# Patient Record
Sex: Male | Born: 1945 | Race: White | Hispanic: No | Marital: Married | Smoking: Former smoker
Health system: Southern US, Community
[De-identification: ages and names within clinical notes are randomized; demographics above are authoritative.]

## PROBLEM LIST (undated history)

## (undated) DIAGNOSIS — K219 Gastro-esophageal reflux disease without esophagitis: Secondary | ICD-10-CM

## (undated) DIAGNOSIS — R0600 Dyspnea, unspecified: Secondary | ICD-10-CM

## (undated) DIAGNOSIS — F4310 Post-traumatic stress disorder, unspecified: Secondary | ICD-10-CM

## (undated) DIAGNOSIS — J45909 Unspecified asthma, uncomplicated: Secondary | ICD-10-CM

## (undated) DIAGNOSIS — I10 Essential (primary) hypertension: Secondary | ICD-10-CM

## (undated) DIAGNOSIS — E119 Type 2 diabetes mellitus without complications: Secondary | ICD-10-CM

## (undated) HISTORY — PX: FINGER SURGERY: SHX640

## (undated) HISTORY — PX: HERNIA REPAIR: SHX51

## (undated) HISTORY — PX: OTHER SURGICAL HISTORY: SHX169

## (undated) HISTORY — PX: MANDIBLE SURGERY: SHX707

## (undated) HISTORY — PX: COLONOSCOPY: SHX174

---

## 2019-08-28 ENCOUNTER — Encounter (HOSPITAL_COMMUNITY)
Admission: EM | Disposition: A | Discharge status: Home / Self Care | Payer: Self-pay | Source: Home / Self Care | Attending: Emergency Medicine

## 2019-08-28 ENCOUNTER — Emergency Department (HOSPITAL_COMMUNITY): Payer: Medicare Other

## 2019-08-28 ENCOUNTER — Other Ambulatory Visit: Payer: Self-pay

## 2019-08-28 ENCOUNTER — Emergency Department (HOSPITAL_COMMUNITY): Payer: Medicare Other | Admitting: Anesthesiology

## 2019-08-28 ENCOUNTER — Encounter (HOSPITAL_COMMUNITY): Payer: Self-pay | Admitting: Emergency Medicine

## 2019-08-28 ENCOUNTER — Ambulatory Visit (HOSPITAL_COMMUNITY)
Admission: EM | Admit: 2019-08-28 | Discharge: 2019-08-28 | Disposition: A | Discharge status: Home / Self Care | Payer: Medicare Other | Attending: Emergency Medicine | Admitting: Emergency Medicine

## 2019-08-28 DIAGNOSIS — S91012A Laceration without foreign body, left ankle, initial encounter: Secondary | ICD-10-CM | POA: Diagnosis not present

## 2019-08-28 DIAGNOSIS — E119 Type 2 diabetes mellitus without complications: Secondary | ICD-10-CM | POA: Diagnosis not present

## 2019-08-28 DIAGNOSIS — W208XXA Other cause of strike by thrown, projected or falling object, initial encounter: Secondary | ICD-10-CM | POA: Insufficient documentation

## 2019-08-28 DIAGNOSIS — Z7984 Long term (current) use of oral hypoglycemic drugs: Secondary | ICD-10-CM | POA: Insufficient documentation

## 2019-08-28 DIAGNOSIS — Z7982 Long term (current) use of aspirin: Secondary | ICD-10-CM | POA: Insufficient documentation

## 2019-08-28 DIAGNOSIS — Z20828 Contact with and (suspected) exposure to other viral communicable diseases: Secondary | ICD-10-CM | POA: Diagnosis not present

## 2019-08-28 DIAGNOSIS — S8252XA Displaced fracture of medial malleolus of left tibia, initial encounter for closed fracture: Secondary | ICD-10-CM | POA: Insufficient documentation

## 2019-08-28 DIAGNOSIS — Z79899 Other long term (current) drug therapy: Secondary | ICD-10-CM | POA: Insufficient documentation

## 2019-08-28 DIAGNOSIS — R0602 Shortness of breath: Secondary | ICD-10-CM | POA: Insufficient documentation

## 2019-08-28 DIAGNOSIS — I10 Essential (primary) hypertension: Secondary | ICD-10-CM | POA: Diagnosis not present

## 2019-08-28 DIAGNOSIS — S90511A Abrasion, right ankle, initial encounter: Secondary | ICD-10-CM | POA: Insufficient documentation

## 2019-08-28 HISTORY — PX: I & D EXTREMITY: SHX5045

## 2019-08-28 HISTORY — DX: Essential (primary) hypertension: I10

## 2019-08-28 HISTORY — DX: Dyspnea, unspecified: R06.00

## 2019-08-28 HISTORY — DX: Type 2 diabetes mellitus without complications: E11.9

## 2019-08-28 LAB — CBC WITH DIFFERENTIAL/PLATELET
Abs Immature Granulocytes: 0.07 10*3/uL (ref 0.00–0.07)
Basophils Absolute: 0.1 10*3/uL (ref 0.0–0.1)
Basophils Relative: 1 %
Eosinophils Absolute: 0.2 10*3/uL (ref 0.0–0.5)
Eosinophils Relative: 3 %
HCT: 42.1 % (ref 39.0–52.0)
Hemoglobin: 13.9 g/dL (ref 13.0–17.0)
Immature Granulocytes: 1 %
Lymphocytes Relative: 19 %
Lymphs Abs: 1.4 10*3/uL (ref 0.7–4.0)
MCH: 32.1 pg (ref 26.0–34.0)
MCHC: 33 g/dL (ref 30.0–36.0)
MCV: 97.2 fL (ref 80.0–100.0)
Monocytes Absolute: 0.7 10*3/uL (ref 0.1–1.0)
Monocytes Relative: 9 %
Neutro Abs: 4.9 10*3/uL (ref 1.7–7.7)
Neutrophils Relative %: 67 %
Platelets: 239 10*3/uL (ref 150–400)
RBC: 4.33 MIL/uL (ref 4.22–5.81)
RDW: 13.1 % (ref 11.5–15.5)
WBC: 7.3 10*3/uL (ref 4.0–10.5)
nRBC: 0 % (ref 0.0–0.2)

## 2019-08-28 LAB — COMPREHENSIVE METABOLIC PANEL
ALT: 24 U/L (ref 0–44)
AST: 23 U/L (ref 15–41)
Albumin: 4.1 g/dL (ref 3.5–5.0)
Alkaline Phosphatase: 51 U/L (ref 38–126)
Anion gap: 10 (ref 5–15)
BUN: 16 mg/dL (ref 8–23)
CO2: 23 mmol/L (ref 22–32)
Calcium: 9.1 mg/dL (ref 8.9–10.3)
Chloride: 104 mmol/L (ref 98–111)
Creatinine, Ser: 0.95 mg/dL (ref 0.61–1.24)
GFR calc Af Amer: >60 mL/min (ref >60)
GFR calc non Af Amer: >60 mL/min (ref >60)
Glucose, Bld: 206 mg/dL — ABNORMAL HIGH (ref 70–99)
Potassium: 3.5 mmol/L (ref 3.5–5.1)
Sodium: 137 mmol/L (ref 135–145)
Total Bilirubin: 0.7 mg/dL (ref 0.3–1.2)
Total Protein: 6.4 g/dL — ABNORMAL LOW (ref 6.5–8.1)

## 2019-08-28 LAB — TROPONIN I (HIGH SENSITIVITY)
Troponin I (High Sensitivity): 39 ng/L — ABNORMAL HIGH (ref <18)
Troponin I (High Sensitivity): 38 ng/L — ABNORMAL HIGH (ref <18)

## 2019-08-28 LAB — GLUCOSE, CAPILLARY
Glucose-Capillary: 123 mg/dL — ABNORMAL HIGH (ref 70–99)
Glucose-Capillary: 103 mg/dL — ABNORMAL HIGH (ref 70–99)

## 2019-08-28 LAB — SARS CORONAVIRUS 2 BY RT PCR (HOSPITAL ORDER, PERFORMED IN ~~LOC~~ HOSPITAL LAB): SARS Coronavirus 2: NEGATIVE

## 2019-08-28 LAB — BRAIN NATRIURETIC PEPTIDE: B Natriuretic Peptide: 50.7 pg/mL (ref 0.0–100.0)

## 2019-08-28 LAB — D-DIMER, QUANTITATIVE: D-Dimer, Quant: 0.66 ug/mL-FEU — ABNORMAL HIGH (ref 0.00–0.50)

## 2019-08-28 SURGERY — IRRIGATION AND DEBRIDEMENT EXTREMITY
Anesthesia: General | Site: Ankle | Laterality: Left

## 2019-08-28 MED ORDER — BUPIVACAINE HCL (PF) 0.25 % IJ SOLN
INTRAMUSCULAR | Status: AC
  Filled 2019-08-28: qty 30

## 2019-08-28 MED ORDER — TETANUS-DIPHTH-ACELL PERTUSSIS 5-2.5-18.5 LF-MCG/0.5 IM SUSP
0.5000 mL | Freq: Once | INTRAMUSCULAR | Status: AC
  Administered 2019-08-28: 16:00:00 0.5 mL via INTRAMUSCULAR
  Filled 2019-08-28: qty 0.5

## 2019-08-28 MED ORDER — ASPIRIN EC 81 MG PO TBEC: 81.0000 mg | DELAYED_RELEASE_TABLET | Freq: Two times a day (BID) | ORAL | 0 refills | Status: AC

## 2019-08-28 MED ORDER — ONDANSETRON HCL 4 MG/2ML IJ SOLN
INTRAMUSCULAR | Status: AC
  Filled 2019-08-28: qty 2

## 2019-08-28 MED ORDER — FENTANYL CITRATE (PF) 100 MCG/2ML IJ SOLN
INTRAMUSCULAR | Status: AC
  Filled 2019-08-28: qty 2

## 2019-08-28 MED ORDER — CEFAZOLIN SODIUM-DEXTROSE 2-3 GM-%(50ML) IV SOLR
INTRAVENOUS | Status: DC | PRN
  Administered 2019-08-28: 2 g via INTRAVENOUS

## 2019-08-28 MED ORDER — SODIUM CHLORIDE 0.9 % IV SOLN
3.0000 g | Freq: Once | INTRAVENOUS | Status: AC
  Administered 2019-08-28: 16:00:00 3 g via INTRAVENOUS
  Filled 2019-08-28: qty 8

## 2019-08-28 MED ORDER — OXYCODONE HCL 5 MG PO TABS: 5.0000 mg | ORAL_TABLET | Freq: Four times a day (QID) | ORAL | 0 refills | Status: DC | PRN

## 2019-08-28 MED ORDER — EPHEDRINE 5 MG/ML INJ
INTRAVENOUS | Status: AC
  Filled 2019-08-28: qty 10

## 2019-08-28 MED ORDER — FENTANYL CITRATE (PF) 100 MCG/2ML IJ SOLN
25.0000 ug | INTRAMUSCULAR | Status: DC | PRN
  Administered 2019-08-28 (×2): 25 ug via INTRAVENOUS

## 2019-08-28 MED ORDER — ONDANSETRON HCL 4 MG/2ML IJ SOLN: 4.0000 mg | Freq: Once | INTRAMUSCULAR | Status: DC | PRN

## 2019-08-28 MED ORDER — CEPHALEXIN 500 MG PO CAPS: 500.0000 mg | ORAL_CAPSULE | Freq: Three times a day (TID) | ORAL | 0 refills | Status: AC

## 2019-08-28 MED ORDER — CEFAZOLIN SODIUM 1 G IJ SOLR
INTRAMUSCULAR | Status: AC
  Filled 2019-08-28: qty 20

## 2019-08-28 MED ORDER — PROPOFOL 10 MG/ML IV BOLUS
INTRAVENOUS | Status: DC | PRN
  Administered 2019-08-28: 150 mg via INTRAVENOUS

## 2019-08-28 MED ORDER — ONDANSETRON HCL 4 MG PO TABS: 4.0000 mg | ORAL_TABLET | Freq: Three times a day (TID) | ORAL | 0 refills | Status: DC | PRN

## 2019-08-28 MED ORDER — LIDOCAINE 2% (20 MG/ML) 5 ML SYRINGE
INTRAMUSCULAR | Status: AC
  Filled 2019-08-28: qty 5

## 2019-08-28 MED ORDER — SODIUM CHLORIDE 0.9 % IR SOLN
Status: DC | PRN
  Administered 2019-08-28: 1000 mL

## 2019-08-28 MED ORDER — DEXAMETHASONE SODIUM PHOSPHATE 10 MG/ML IJ SOLN
INTRAMUSCULAR | Status: AC
  Filled 2019-08-28: qty 1

## 2019-08-28 MED ORDER — BACLOFEN 10 MG PO TABS: 10.0000 mg | ORAL_TABLET | Freq: Two times a day (BID) | ORAL | 0 refills | Status: DC | PRN

## 2019-08-28 MED ORDER — STERILE WATER FOR IRRIGATION IR SOLN
Status: DC | PRN
  Administered 2019-08-28: 400 mL

## 2019-08-28 MED ORDER — SUCCINYLCHOLINE CHLORIDE 200 MG/10ML IV SOSY
PREFILLED_SYRINGE | INTRAVENOUS | Status: DC | PRN
  Administered 2019-08-28: 100 mg via INTRAVENOUS

## 2019-08-28 MED ORDER — OXYCODONE HCL 5 MG PO TABS
ORAL_TABLET | ORAL | Status: AC
  Filled 2019-08-28: qty 1

## 2019-08-28 MED ORDER — CELECOXIB 100 MG PO CAPS: 100.0000 mg | ORAL_CAPSULE | Freq: Two times a day (BID) | ORAL | 0 refills | Status: AC

## 2019-08-28 MED ORDER — OXYCODONE HCL 5 MG PO TABS
5.0000 mg | ORAL_TABLET | Freq: Once | ORAL | Status: AC
  Administered 2019-08-28: 22:00:00 5 mg via ORAL

## 2019-08-28 MED ORDER — SUFENTANIL CITRATE 50 MCG/ML IV SOLN
INTRAVENOUS | Status: AC
  Filled 2019-08-28: qty 1

## 2019-08-28 MED ORDER — ONDANSETRON HCL 4 MG/2ML IJ SOLN
INTRAMUSCULAR | Status: DC | PRN
  Administered 2019-08-28: 4 mg via INTRAVENOUS

## 2019-08-28 MED ORDER — AEROCHAMBER PLUS FLO-VU SMALL MISC
1.0000 | Freq: Once | Status: DC
  Filled 2019-08-28: qty 1

## 2019-08-28 MED ORDER — LIDOCAINE 2% (20 MG/ML) 5 ML SYRINGE
INTRAMUSCULAR | Status: DC | PRN
  Administered 2019-08-28: 100 mg via INTRAVENOUS

## 2019-08-28 MED ORDER — ALBUTEROL SULFATE HFA 108 (90 BASE) MCG/ACT IN AERS: 2.0000 | INHALATION_SPRAY | Freq: Once | RESPIRATORY_TRACT | Status: DC

## 2019-08-28 MED ORDER — EPHEDRINE SULFATE 50 MG/ML IJ SOLN
INTRAMUSCULAR | Status: DC | PRN
  Administered 2019-08-28: 10 mg via INTRAVENOUS

## 2019-08-28 MED ORDER — LACTATED RINGERS IV SOLN
INTRAVENOUS | Status: DC
  Administered 2019-08-28 (×2): via INTRAVENOUS

## 2019-08-28 MED ORDER — SODIUM CHLORIDE 0.9 % IR SOLN
Status: DC | PRN
  Administered 2019-08-28: 3000 mL

## 2019-08-28 MED ORDER — BUPIVACAINE HCL 0.25 % IJ SOLN
INTRAMUSCULAR | Status: DC | PRN
  Administered 2019-08-28: 30 mL

## 2019-08-28 MED ORDER — SUCCINYLCHOLINE CHLORIDE 200 MG/10ML IV SOSY
PREFILLED_SYRINGE | INTRAVENOUS | Status: AC
  Filled 2019-08-28: qty 10

## 2019-08-28 MED ORDER — PROPOFOL 10 MG/ML IV BOLUS
INTRAVENOUS | Status: AC
  Filled 2019-08-28: qty 20

## 2019-08-28 MED ORDER — SUFENTANIL CITRATE 50 MCG/ML IV SOLN
INTRAVENOUS | Status: DC | PRN
  Administered 2019-08-28 (×2): 10 ug via INTRAVENOUS

## 2019-08-28 MED ORDER — GLYCOPYRROLATE 0.2 MG/ML IJ SOLN
INTRAMUSCULAR | Status: DC | PRN
  Administered 2019-08-28: 0.2 mg via INTRAVENOUS

## 2019-08-28 MED ORDER — ACETAMINOPHEN 500 MG PO TABS: 1000.0000 mg | ORAL_TABLET | Freq: Three times a day (TID) | ORAL | 0 refills | Status: AC

## 2019-08-28 MED ORDER — SODIUM CHLORIDE (PF) 0.9 % IJ SOLN
INTRAMUSCULAR | Status: AC
  Filled 2019-08-28: qty 10

## 2019-08-28 MED ORDER — GLYCOPYRROLATE PF 0.2 MG/ML IJ SOSY
PREFILLED_SYRINGE | INTRAMUSCULAR | Status: AC
  Filled 2019-08-28: qty 1

## 2019-08-28 MED ORDER — DEXAMETHASONE SODIUM PHOSPHATE 10 MG/ML IJ SOLN
INTRAMUSCULAR | Status: DC | PRN
  Administered 2019-08-28: 10 mg via INTRAVENOUS

## 2019-08-28 SURGICAL SUPPLY — 57 items
BANDAGE ESMARK 6X9 LF (GAUZE/BANDAGES/DRESSINGS) IMPLANT
BLADE SURG 10 STRL SS (BLADE) IMPLANT
BNDG COHESIVE 4X5 TAN STRL (GAUZE/BANDAGES/DRESSINGS) IMPLANT
BNDG ELASTIC 4X5.8 VLCR STR LF (GAUZE/BANDAGES/DRESSINGS) ×2 IMPLANT
BNDG ELASTIC 6X5.8 VLCR STR LF (GAUZE/BANDAGES/DRESSINGS) ×2 IMPLANT
BNDG ESMARK 4X9 LF (GAUZE/BANDAGES/DRESSINGS) IMPLANT
BNDG ESMARK 6X9 LF (GAUZE/BANDAGES/DRESSINGS)
BNDG GAUZE ELAST 4 BULKY (GAUZE/BANDAGES/DRESSINGS) ×2 IMPLANT
CONT SPEC 4OZ CLIKSEAL STRL BL (MISCELLANEOUS) IMPLANT
COVER SURGICAL LIGHT HANDLE (MISCELLANEOUS) ×2 IMPLANT
COVER WAND RF STERILE (DRAPES) IMPLANT
CUFF TOURN SGL LL 12 NO SLV (MISCELLANEOUS) ×2 IMPLANT
CUFF TOURN SGL QUICK 34 (TOURNIQUET CUFF)
CUFF TRNQT CYL 34X4.125X (TOURNIQUET CUFF) IMPLANT
DRAPE SURG 17X23 STRL (DRAPES) IMPLANT
DRAPE U-SHAPE 47X51 STRL (DRAPES) ×2 IMPLANT
DRSG ADAPTIC 3X8 NADH LF (GAUZE/BANDAGES/DRESSINGS) ×2 IMPLANT
DRSG PAD ABDOMINAL 8X10 ST (GAUZE/BANDAGES/DRESSINGS) IMPLANT
DURAPREP 26ML APPLICATOR (WOUND CARE) IMPLANT
ELECT REM PT RETURN 9FT ADLT (ELECTROSURGICAL) ×2
ELECTRODE REM PT RTRN 9FT ADLT (ELECTROSURGICAL) ×1 IMPLANT
EVACUATOR 1/8 PVC DRAIN (DRAIN) IMPLANT
FACESHIELD WRAPAROUND (MASK) IMPLANT
GAUZE SPONGE 4X4 12PLY STRL (GAUZE/BANDAGES/DRESSINGS) IMPLANT
GAUZE SPONGE 4X4 12PLY STRL LF (GAUZE/BANDAGES/DRESSINGS) ×2 IMPLANT
GAUZE XEROFORM 1X8 LF (GAUZE/BANDAGES/DRESSINGS) IMPLANT
GLOVE BIO SURGEON STRL SZ7.5 (GLOVE) ×4 IMPLANT
GLOVE BIO SURGEON STRL SZ8 (GLOVE) ×2 IMPLANT
GLOVE BIOGEL PI IND STRL 8 (GLOVE) ×2 IMPLANT
GLOVE BIOGEL PI INDICATOR 8 (GLOVE) ×2
GOWN STRL REUS W/ TWL LRG LVL3 (GOWN DISPOSABLE) ×3 IMPLANT
GOWN STRL REUS W/TWL LRG LVL3 (GOWN DISPOSABLE) ×3
HANDPIECE INTERPULSE COAX TIP (DISPOSABLE)
KIT BASIN OR (CUSTOM PROCEDURE TRAY) ×2 IMPLANT
KIT TURNOVER KIT B (KITS) ×2 IMPLANT
MANIFOLD NEPTUNE II (INSTRUMENTS) ×2 IMPLANT
NEEDLE HYPO 25GX1X1/2 BEV (NEEDLE) ×2 IMPLANT
NS IRRIG 1000ML POUR BTL (IV SOLUTION) ×2 IMPLANT
PACK ORTHO EXTREMITY (CUSTOM PROCEDURE TRAY) ×2 IMPLANT
PAD ABD 8X10 STRL (GAUZE/BANDAGES/DRESSINGS) ×2 IMPLANT
PAD ARMBOARD 7.5X6 YLW CONV (MISCELLANEOUS) ×2 IMPLANT
PAD CAST 4YDX4 CTTN HI CHSV (CAST SUPPLIES) ×1 IMPLANT
PADDING CAST COTTON 4X4 STRL (CAST SUPPLIES) ×1
PADDING CAST COTTON 6X4 STRL (CAST SUPPLIES) ×2 IMPLANT
SET CYSTO W/LG BORE CLAMP LF (SET/KITS/TRAYS/PACK) ×2 IMPLANT
SET HNDPC FAN SPRY TIP SCT (DISPOSABLE) IMPLANT
SPONGE LAP 18X18 RF (DISPOSABLE) IMPLANT
STOCKINETTE IMPERVIOUS 9X36 MD (GAUZE/BANDAGES/DRESSINGS) ×2 IMPLANT
SUT ETHILON 3 0 PS 1 (SUTURE) ×8 IMPLANT
SUT PDS AB 2-0 CT1 27 (SUTURE) IMPLANT
SWAB CULTURE ESWAB REG 1ML (MISCELLANEOUS) IMPLANT
SYR CONTROL 10ML LL (SYRINGE) ×2 IMPLANT
TOWEL GREEN STERILE (TOWEL DISPOSABLE) ×2 IMPLANT
TOWEL GREEN STERILE FF (TOWEL DISPOSABLE) IMPLANT
TUBE CONNECTING 12X1/4 (SUCTIONS) ×2 IMPLANT
UNDERPAD 30X30 (UNDERPADS AND DIAPERS) ×2 IMPLANT
YANKAUER SUCT BULB TIP NO VENT (SUCTIONS) ×2 IMPLANT

## 2019-08-28 NOTE — Anesthesia Preprocedure Evaluation (Addendum)
Anesthesia Evaluation  Patient identified by MRN, date of birth, ID band Patient awake    Reviewed: Allergy & Precautions, NPO status , Patient's Chart, lab work & pertinent test results  Airway Mallampati: II  TM Distance: >3 FB     Dental  (+) Dental Advisory Given   Pulmonary shortness of breath and with exertion,    breath sounds clear to auscultation       Cardiovascular hypertension,  Rhythm:Regular Rate:Normal     Neuro/Psych negative neurological ROS     GI/Hepatic negative GI ROS, Neg liver ROS,   Endo/Other  diabetes  Renal/GU negative Renal ROS     Musculoskeletal   Abdominal   Peds  Hematology negative hematology ROS (+)   Anesthesia Other Findings   Reproductive/Obstetrics                             Lab Results  Component Value Date   WBC 7.3 08/28/2019   HGB 13.9 08/28/2019   HCT 42.1 08/28/2019   MCV 97.2 08/28/2019   PLT 239 08/28/2019   Lab Results  Component Value Date   CREATININE 0.95 08/28/2019   BUN 16 08/28/2019   NA 137 08/28/2019   K 3.5 08/28/2019   CL 104 08/28/2019   CO2 23 08/28/2019    Anesthesia Physical Anesthesia Plan  ASA: III  Anesthesia Plan: General   Post-op Pain Management:    Induction: Intravenous  PONV Risk Score and Plan: 2 and Dexamethasone, Ondansetron and Treatment may vary due to age or medical condition  Airway Management Planned: Oral ETT  Additional Equipment:   Intra-op Plan:   Post-operative Plan: Extubation in OR  Informed Consent: I have reviewed the patients History and Physical, chart, labs and discussed the procedure including the risks, benefits and alternatives for the proposed anesthesia with the patient or authorized representative who has indicated his/her understanding and acceptance.     Dental advisory given  Plan Discussed with: CRNA  Anesthesia Plan Comments:        Anesthesia Quick  Evaluation

## 2019-08-28 NOTE — Transfer of Care (Signed)
Immediate Anesthesia Transfer of Care Note  Patient: Phillip Hamilton  Procedure(s) Performed: IRRIGATION AND DEBRIDEMENT EXTREMITY (Left )  Patient Location: PACU  Anesthesia Type:General  Level of Consciousness: awake, alert , oriented and patient cooperative  Airway & Oxygen Therapy: Patient Spontanous Breathing and Patient connected to nasal cannula oxygen  Post-op Assessment: Report given to RN, Post -op Vital signs reviewed and stable and Patient moving all extremities X 4  Post vital signs: Reviewed and stable  Last Vitals:  Vitals Value Taken Time  BP 165/98 08/28/19 2148  Temp    Pulse 93 08/28/19 2151  Resp 20 08/28/19 2151  SpO2 100 % 08/28/19 2151  Vitals shown include unvalidated device data.  Last Pain:  Vitals:   08/28/19 1517  TempSrc: Oral  PainSc:          Complications: No apparent anesthesia complications

## 2019-08-28 NOTE — ED Notes (Signed)
Pt's wife called and wants to get an update on pt. She is in [place] and she doesn't know if she needs to come home or not.  Lelon Frohlich at [phone]

## 2019-08-28 NOTE — Progress Notes (Signed)
Orthopedic Tech Progress Note Patient Details:  Phillip Hamilton 1946/07/01 [id number]  Ortho Devices Type of Ortho Device: Crutches Ortho Device/Splint Location: delivered to or. Ortho Device/Splint Interventions: Ordered, Application, Adjustment   Post Interventions Patient Tolerated: Well Instructions Provided: Care of device, Adjustment of device   Karolee Stamps 08/28/2019, 11:03 PM

## 2019-08-28 NOTE — Anesthesia Procedure Notes (Signed)
Procedure Name: Intubation Date/Time: 08/28/2019 8:46 PM Performed by: Claris Che, CRNA Pre-anesthesia Checklist: Patient identified, Emergency Drugs available, Suction available, Patient being monitored and Timeout performed Patient Re-evaluated:Patient Re-evaluated prior to induction Oxygen Delivery Method: Circle system utilized Preoxygenation: Pre-oxygenation with 100% oxygen Induction Type: IV induction, Rapid sequence and Cricoid Pressure applied Laryngoscope Size: Mac and 3 Grade View: Grade III Tube type: Oral Tube size: 7.5 mm Number of attempts: 1 Airway Equipment and Method: Stylet Placement Confirmation: ETT inserted through vocal cords under direct vision,  positive ETCO2 and breath sounds checked- equal and bilateral Secured at: 24 cm Tube secured with: Tape Dental Injury: Teeth and Oropharynx as per pre-operative assessment

## 2019-08-28 NOTE — Progress Notes (Signed)
Orthopedic Tech Progress Note Patient Details:  Phillip Hamilton [birth year] [id number]  Ortho Devices Type of Ortho Device: CAM walker Ortho Device/Splint Location: delivered to or.       Karolee Stamps 08/28/2019, 10:38 PM

## 2019-08-28 NOTE — Discharge Instructions (Signed)
It is very important for you to elevate your foot above your heart at all times or as much as reasonably possible to reduce swelling and pain.    Diet: As you were doing prior to hospitalization   Shower:  Keep the wounds dry, use an occlusive plastic wrap, NO SOAKING IN TUB.  If the bandage gets wet, change with a clean dry gauze.  If you have a splint on, leave the splint in place and keep the splint dry with a plastic bag.  Dressing:  Keep compressive dressings on and dry until follow up.  Weight Bearing:   As tolerated in walking boot.  Limit walking to essential movement.    To prevent constipation: you may use a stool softener such as -  Colace (over the counter) 100 mg by mouth twice a day  Drink plenty of fluids (prune juice may be helpful) and high fiber foods Miralax (over the counter) for constipation as needed.    Itching:  If you experience itching with your medications, try taking only a single pain pill, or even half a pain pill at a time.  You can also use benadryl over the counter for itching or also to help with sleep.   Precautions:  If you experience chest pain or shortness of breath - call 911 immediately for transfer to the hospital emergency department!!  If you develop a fever greater that 101 F, purulent drainage from wound, increased redness or drainage from wound, or calf pain -- Call the office at [phone]                                                 Follow- Up Appointment:  Please call for an appointment to be seen on 09/01/19 [facility] - [phone]     Please follow-up with pulmonologist doctor for further evaluation of your shortness of breath.

## 2019-08-28 NOTE — ED Triage Notes (Signed)
[facility]- pt here for a left ankle injury from a tractor accident today. Pt states a piece of tractor equipment fell on him and caught his ankle underneath. Pt also has been having SOB for 4 months.

## 2019-08-28 NOTE — H&P (Signed)
ORTHOPAEDIC CONSULTATION  REQUESTING PHYSICIAN: No att. providers found  Chief Complaint: left ankle laceration.  HPI: Phillip Hamilton is a 73 y.o. male who complains of L ankle pain after a piece of metal equipment fell and scraped down both legs and feet. Xrays have been negative for fracture.   Past Medical History:  Diagnosis Date  . Diabetes mellitus without complication ([place])   . Dyspnea   . Hypertension    History reviewed. No pertinent surgical history. Social History   Socioeconomic History  . Marital status: Unknown    Spouse name: Not on file  . Number of children: Not on file  . Years of education: Not on file  . Highest education level: Not on file  Occupational History  . Not on file  Social Needs  . Financial resource strain: Not on file  . Food insecurity    Worry: Not on file    Inability: Not on file  . Transportation needs    Medical: Not on file    Non-medical: Not on file  Tobacco Use  . Smoking status: Not on file  Substance and Sexual Activity  . Alcohol use: Not on file  . Drug use: Not on file  . Sexual activity: Not on file  Lifestyle  . Physical activity    Days per week: Not on file    Minutes per session: Not on file  . Stress: Not on file  Relationships  . Social Herbalist on phone: Not on file    Gets together: Not on file    Attends religious service: Not on file    Active member of club or organization: Not on file    Attends meetings of clubs or organizations: Not on file    Relationship status: Not on file  Other Topics Concern  . Not on file  Social History Narrative  . Not on file   No family history on file. No Known Allergies Prior to Admission medications   Medication Sig Start Date End Date Taking? Authorizing Provider  aspirin EC 81 MG tablet Take 81 mg by mouth at bedtime.   Yes [provider]  atorvastatin (LIPITOR) 40 MG tablet Take 20 mg by mouth at bedtime.   Yes [provider]  docusate sodium (COLACE) 100 MG capsule Take 100 mg by mouth at bedtime.   Yes [provider]  glimepiride (AMARYL) 2 MG tablet Take 2 mg by mouth every morning.   Yes [provider]  lisinopril (ZESTRIL) 40 MG tablet Take 20 mg by mouth every morning.   Yes [provider]  metFORMIN (GLUCOPHAGE) 500 MG tablet Take 500 mg by mouth 2 (two) times daily with a meal.   Yes [provider]  omeprazole (PRILOSEC) 20 MG capsule Take 20 mg by mouth 2 (two) times daily.   Yes [provider]  vitamin B-12 (CYANOCOBALAMIN) 1000 MCG tablet Take 1,000 mcg by mouth every morning.   Yes [provider]   Dg Chest Portable 1 View  Result Date: 08/28/2019 CLINICAL DATA:  Shortness of breath.  Forklift accident. EXAM: PORTABLE CHEST 1 VIEW COMPARISON:  None. FINDINGS: Lungs are adequately inflated without consolidation or effusion. No pneumothorax. Cardiomediastinal silhouette is within normal. No acute fracture. IMPRESSION: No acute findings. Electronically Signed   By: Marin Olp M.D.   On: 08/28/2019 16:04   Dg Ankle Left Port  Result Date: 08/28/2019 CLINICAL DATA:  Left ankle injury by  a forklift today. Initial encounter. EXAM: PORTABLE LEFT ANKLE - 2 VIEW COMPARISON:  None. FINDINGS: Large laceration is seen along the distal aspect of the tibia on the posterior and medial sides. No underlying foreign body. No fracture or dislocation. IMPRESSION: Large laceration without underlying fracture or foreign body. Electronically Signed   By: Inge Rise M.D.   On: 08/28/2019 16:00   Dg Ankle Right Port  Result Date: 08/28/2019 CLINICAL DATA:  Right ankle pain after forklift accident. EXAM: PORTABLE RIGHT ANKLE - 2 VIEW COMPARISON:  None. FINDINGS: Ankle mortise is normal. Few small chronic fragments adjacent the tip of the fibula. No evidence of acute fracture or dislocation. IMPRESSION: No acute findings. Electronically Signed   By:  Marin Olp M.D.   On: 08/28/2019 16:05    Positive ROS: All other systems have been reviewed and were otherwise negative with the exception of those mentioned in the HPI and as above.  Labs cbc Recent Labs    08/28/19 1521  WBC 7.3  HGB 13.9  HCT 42.1  PLT 239    Labs inflam No results for input(s): CRP in the last 72 hours.  Invalid input(s): ESR  Labs coag No results for input(s): INR, PTT in the last 72 hours.  Invalid input(s): PT  Recent Labs    08/28/19 1521  NA 137  K 3.5  CL 104  CO2 23  GLUCOSE 206*  BUN 16  CREATININE 0.95  CALCIUM 9.1    Physical Exam: Vitals:   08/28/19 1730 08/28/19 1900  BP: (!) 143/87 (!) 158/84  Pulse: 71 (!) 58  Resp: (!) 23 17  Temp:    SpO2: 97% 100%   General: Alert, no acute distress Cardiovascular: No pedal edema Respiratory: No cyanosis, no use of accessory musculature GI: No organomegaly, abdomen is soft and non-tender Skin: No lesions in the area of chief complaint other than those listed below in MSK exam.  Neurologic: Sensation intact distally save for the below mentioned MSK exam Psychiatric: Patient is competent for consent with normal mood and affect Lymphatic: No axillary or cervical lymphadenopathy  MUSCULOSKELETAL:  BLE: abrasions and above mentioned laceration. Both are NVI, soft compartments Other extremities are atraumatic with painless ROM and NVI.  Assessment: RLE abrasions LLE laceration  Plan: OR for debridment and complex wound repair.    Renette Butters, MD Cell [phone]   08/28/2019 8:41 PM

## 2019-08-28 NOTE — ED Provider Notes (Signed)
Medical screening examination/treatment/procedure(s) were conducted as a shared visit with non-physician practitioner(s) and myself.  I personally evaluated the patient during the encounter.  EKG Interpretation  Date/Time:  Monday August 28 2019 15:32:58 EDT Ventricular Rate:  66 PR Interval:    QRS Duration: 96 QT Interval:  405 QTC Calculation: 425 R Axis:   44 Text Interpretation:  Sinus rhythm No old tracing to compare Confirmed by Dorie Rank [phone]) on 08/28/2019 3:47:17 PM  Pt presents with large avulsion injury around left ankle after a tractor injury.    Large flap of medial ankle, bone and tendon exposed.  Will keep NPO.  Pt will likely need orthopedic involvement for adequate repair.   Dorie Rank, MD 08/28/19 [phone]

## 2019-08-28 NOTE — ED Provider Notes (Signed)
[facility] EMERGENCY DEPARTMENT Provider Note   CSN: [id number] Arrival date & time: 08/28/19  1508     History   Chief Complaint No chief complaint on file.   HPI Phillip Hamilton is a 73 y.o. male.     Patient presents to the emergency department today with an acute complaint of crush injury to his bilateral feet and ankles.  Secondary complaint is progressively worsening shortness of breath since March 2020.  Just prior to arrival, patient was working with a front end loader.  He states that an approximately 400 pound piece of metal equipment fell and landed on his bilateral feet.  He had to pull his feet out from underneath the piece of equipment.  He sustained a large laceration to his left medial ankle.  He also sustained abrasions to his right ankle.  He was able to walk about 200 feet to call for help.  EMS was called.  Wound was bandaged.  Patient is uncertain of his last tetanus.  Patient also complains of persistent shortness of breath, waxing and waning.  Patient states that he had "the flu" for approximately 10 weeks around March 2020.  He states that he never fully recovered and has been short of breath since that time.  He states it just felt like "my new normal".  There are times where it is better at times and it was worse.  He has not been seen at the [place], his PCP, for this complaint recently.  He denies any coronavirus exposures.  He states that his shortness of breath today was worse because of "all the excitement".  He was placed on oxygen by EMS but was not hypoxic on scene.  He denies any associated chest pain, vomiting, jaw or arm pain.  He states that his pain is persistently worse with activity but he continues to try to work.     History reviewed. No pertinent past medical history.  There are no active problems to display for this patient.   History reviewed. No pertinent surgical history.      Home Medications    Prior to Admission  medications   Not on File    Family History No family history on file.  Social History Social History   Tobacco Use  . Smoking status: Not on file  Substance Use Topics  . Alcohol use: Not on file  . Drug use: Not on file     Allergies   Patient has no allergy information on record.   Review of Systems Review of Systems  Constitutional: Negative for diaphoresis and fever.  Eyes: Negative for redness.  Respiratory: Positive for shortness of breath. Negative for cough.   Cardiovascular: Negative for chest pain, palpitations and leg swelling.  Gastrointestinal: Negative for abdominal pain, nausea and vomiting.  Genitourinary: Negative for dysuria.  Musculoskeletal: Positive for arthralgias and myalgias. Negative for back pain and neck pain.  Skin: Positive for color change and wound. Negative for rash.  Neurological: Negative for syncope, light-headedness and numbness.  Psychiatric/Behavioral: The patient is not nervous/anxious.      Physical Exam Updated Vital Signs BP (!) 162/87 (BP Location: Right Arm)   Pulse 71   Temp 98.2 F (36.8 C) (Oral)   Resp (!) 25   Ht 6\' 5"  (1.956 m)   Wt 99.3 kg   SpO2 100%   BMI 25.97 kg/m   Physical Exam Vitals signs and nursing note reviewed.  Constitutional:      Appearance: He  is well-developed.  HENT:     Head: Normocephalic and atraumatic.  Eyes:     General:        Right eye: No discharge.        Left eye: No discharge.     Conjunctiva/sclera: Conjunctivae normal.  Neck:     Musculoskeletal: Normal range of motion and neck supple.  Cardiovascular:     Rate and Rhythm: Normal rate and regular rhythm.     Heart sounds: Normal heart sounds.  Pulmonary:     Effort: Pulmonary effort is normal. Tachypnea present. No accessory muscle usage or respiratory distress.     Breath sounds: Examination of the left-upper field reveals rhonchi and rales. Examination of the left-middle field reveals rhonchi and rales. Examination  of the left-lower field reveals rhonchi and rales. Rhonchi and rales present.  Abdominal:     Palpations: Abdomen is soft.     Tenderness: There is no abdominal tenderness.  Musculoskeletal:     Right ankle: He exhibits normal range of motion, no swelling and no ecchymosis. No tenderness.     Left ankle: He exhibits decreased range of motion and swelling. He exhibits no ecchymosis. Tenderness. Medial malleolus tenderness found.       Feet:  Skin:    General: Skin is warm and dry.  Neurological:     Mental Status: He is alert.            ED Treatments / Results  Labs (all labs ordered are listed, but only abnormal results are displayed) Labs Reviewed  COMPREHENSIVE METABOLIC PANEL - Abnormal; Notable for the following components:      Result Value   Glucose, Bld 206 (*)    Total Protein 6.4 (*)    All other components within normal limits  D-DIMER, QUANTITATIVE (NOT AT [facility]) - Abnormal; Notable for the following components:   D-Dimer, Quant 0.66 (*)    All other components within normal limits  TROPONIN I (HIGH SENSITIVITY) - Abnormal; Notable for the following components:   Troponin I (High Sensitivity) 39 (*)    All other components within normal limits  TROPONIN I (HIGH SENSITIVITY) - Abnormal; Notable for the following components:   Troponin I (High Sensitivity) 38 (*)    All other components within normal limits  SARS CORONAVIRUS 2 (HOSPITAL ORDER, PERFORMED IN [facility])  CBC WITH DIFFERENTIAL/PLATELET  BRAIN NATRIURETIC PEPTIDE    ED ECG REPORT   Date: 08/28/2019  Rate: 66  Rhythm: normal sinus rhythm  QRS Axis: normal  Intervals: normal  ST/T Wave abnormalities: normal  Conduction Disutrbances:none  Narrative Interpretation:   Old EKG Reviewed: none available  I have personally reviewed the EKG tracing and agree with the computerized printout as noted.  Radiology Dg Ankle Left Port  Result Date: 08/28/2019 CLINICAL DATA:  Left ankle  injury by a forklift today. Initial encounter. EXAM: PORTABLE LEFT ANKLE - 2 VIEW COMPARISON:  None. FINDINGS: Large laceration is seen along the distal aspect of the tibia on the posterior and medial sides. No underlying foreign body. No fracture or dislocation. IMPRESSION: Large laceration without underlying fracture or foreign body. Electronically Signed   By: Drusilla Kanner M.D.   On: 08/28/2019 16:00    Procedures Procedures (including critical care time)  Medications Ordered in ED Medications  albuterol (VENTOLIN HFA) 108 (90 Base) MCG/ACT inhaler 2 puff (has no administration in time range)  AeroChamber Plus Flo-Vu Small device MISC 1 each (has no administration in time range)  Ampicillin-Sulbactam (UNASYN) 3 g in sodium chloride 0.9 % 100 mL IVPB (0 g Intravenous Stopped 08/28/19 1710)  Tdap (BOOSTRIX) injection 0.5 mL (0.5 mLs Intramuscular Given 08/28/19 1602)     Initial Impression / Assessment and Plan / ED Course  I have reviewed the triage vital signs and the nursing notes.  Pertinent labs & imaging results that were available during my care of the patient were reviewed by me and considered in my medical decision making (see chart for details).        Patient seen and examined. Work-up initiated. Medications ordered. On my initial exam, distal sensation completely intact. Cap refill = 2 seconds all toes.  The heel is cool to touch compared to right but not cyanotic.  Reminder of foot is warm. Do not suspect an acute vascular injury.   Vital signs reviewed and are as follows: BP (!) 162/87 (BP Location: Right Arm)   Pulse 71   Temp 98.2 F (36.8 C) (Oral)   Resp (!) 25   Ht 6\' 5"  (1.956 m)   Wt 99.3 kg   SpO2 100%   BMI 25.97 kg/m   3:46 PM EKG reviewed. Port CXR reviewed.   Pt discussed with and seen by Dr. Lynelle DoctorKnapp.   4:03 PM Wound recheck. Slow oozing of blood. Heel warmer, foot appears well perfused.  Pictures taken with patient's verbal consent.    4:25 PM  Spoke with Dr. Eulah PontMurphy or ortho. Pt will likely be taken to OR later tonight for repair.   Trop is slightly high. Will check d-dimer. Still determining need for admission 2/2 SOB. Will need d-dimer and 2nd trop.   5:19 PM Spoke with Dr. Eulah PontMurphy who called back. States he would be comfortable with us washing out and closing if no need for admission. Discussed we are still determining need. Discussed with Dr. Lynelle DoctorKnapp again who agrees that this is a little more complex than we would feel comfortable closing ourselves.  Final dispo still to be determined 2/2 to need to work up SOB.   6:17 PM Covid negative. Trop/dimer have been drawn. Awaiting results.   6:26 PM D-dimer 0.66 > age adjusted cut-off of 0.71. Symptoms not acute. Do not feel patient requires CT imaging at this point.   6:47 PM Trop 39>38. Doubt ACS. Will give trial of albuterol. Will need SOB worked up as outpatient. No indications for admission.   6:54 PM Dr. Eulah PontMurphy will take to OR. Will go home after wound repair.  Patient's brother is at bedside who will give him a ride home.  Patient updated on all results tonight.  We discussed the likely differential and what we have ruled out here.  Discussed that he will need follow-up.  Will provide pulmonology referral.  Will give albuterol inhaler to trial for shortness of breath.  Patient is in agreement and comfortable with this plan.  Final Clinical Impressions(s) / ED Diagnoses   Final diagnoses:  Laceration of left ankle, initial encounter  Abrasion of right ankle, initial encounter  Shortness of breath   SOB: This has been ongoing for several months, worse today after his injury.  Rhonchi on left side.  No pneumonia on x-ray.  COVID negative.  Normal BNP.  Troponin slightly elevated but stable.  No signs of ischemia on EKG.  Patient is not tachycardic or hypoxic.  D-dimer is less than age-adjusted value of 0.71 and no clinical signs and symptoms of DVT today.  Laceration of the left  ankle: Measures 15  to 20 cm and involves some deep structures.  Patient has had a persistent oozing of blood during ED stay.  Discussed with orthopedics and appreciate their involvement in repair of this complex laceration.  Patient has received tetanus update and IV Unasyn.  ED Discharge Orders    None       Renne Crigler, Cordelia Poche 08/28/19 1859    Linwood Dibbles, MD 08/30/19 [phone]

## 2019-08-29 ENCOUNTER — Encounter (HOSPITAL_COMMUNITY): Payer: Self-pay | Admitting: Orthopedic Surgery

## 2019-08-29 NOTE — Anesthesia Postprocedure Evaluation (Signed)
Anesthesia Post Note  Patient: Heidi Lemay  Procedure(s) Performed: IRRIGATION AND DEBRIDEMENT LEFT ANKLE;  COMPLEX WOUND REPAIR LEFT ANKLE (Left Ankle)     Patient location during evaluation: PACU Anesthesia Type: General Level of consciousness: awake and alert Pain management: pain level controlled Vital Signs Assessment: post-procedure vital signs reviewed and stable Respiratory status: spontaneous breathing, nonlabored ventilation, respiratory function stable and patient connected to nasal cannula oxygen Cardiovascular status: blood pressure returned to baseline and stable Postop Assessment: no apparent nausea or vomiting Anesthetic complications: no    Last Vitals:  Vitals:   08/28/19 2218 08/28/19 2223  BP: (!) 165/97 (!) 164/96  Pulse: 82 82  Resp: 19 (!) 26  Temp:  36.9 C  SpO2: 99% 98%    Last Pain:  Vitals:   08/28/19 2218  TempSrc:   PainSc: 3                  Tiajuana Amass

## 2019-08-29 NOTE — Op Note (Signed)
08/28/2019  10:08 AM  PATIENT:  Phillip Hamilton    PRE-OPERATIVE DIAGNOSIS:  Left Ankle Wound  POST-OPERATIVE DIAGNOSIS:  Same  PROCEDURE:  IRRIGATION AND DEBRIDEMENT LEFT ANKLE;  COMPLEX WOUND REPAIR LEFT ANKLE  SURGEON:  Renette Butters, MD  ASSISTANT: Roxan Hockey, PA-C, he was present and scrubbed throughout the case, critical for completion in a timely fashion, and for retraction, instrumentation, and closure.   ANESTHESIA:   gen  PREOPERATIVE INDICATIONS:  Phillip Hamilton is a  73 y.o. male with a diagnosis of Left Ankle Wound who failed conservative measures and elected for surgical management.    The risks benefits and alternatives were discussed with the patient preoperatively including but not limited to the risks of infection, bleeding, nerve injury, cardiopulmonary complications, the need for revision surgery, among others, and the patient was willing to proceed.  OPERATIVE IMPLANTS: none  OPERATIVE FINDINGS: small med mal fx stable  BLOOD LOSS: min  COMPLICATIONS: none  TOURNIQUET TIME: 64min  OPERATIVE PROCEDURE:  Patient was identified in the preoperative holding area and site was marked by me He was transported to the operating theater and placed on the table in supine position taking care to pad all bony prominences. After a preincinduction time out anesthesia was induced. The left lower extremity was prepped and draped in normal sterile fashion and a pre-incision timeout was performed. He received ancef for preoperative antibiotics.   I explored his left leg wound it was roughly 18 cm around his medial ankle there was devitalized subcutaneous tissue and fat I debrided this with a scissor.  Posterior tibial tendon toe flexors artery nerve appeared to be intact there was no violation of the fascia over these.  There was a small chip fracture of the medial malleolus I debrided this and remove some devitalized bone the medial mall was stable.  I then performed a  thorough irrigation with 3 L of saline  I then Portec performed a complex closure of these 18 cm of wound.  Sterile dressing was applied he was woken taken the PACU in stable condition  POST OPERATIVE PLAN: Weightbearing as tolerated aspirin immobilization for DVT prophylaxis

## 2019-09-07 ENCOUNTER — Encounter

## 2019-09-07 ENCOUNTER — Ambulatory Visit (INDEPENDENT_AMBULATORY_CARE_PROVIDER_SITE_OTHER): Payer: Medicare Other | Admitting: Plastic Surgery

## 2019-09-07 ENCOUNTER — Telehealth: Payer: Self-pay

## 2019-09-07 ENCOUNTER — Other Ambulatory Visit: Payer: Self-pay

## 2019-09-07 VITALS — BP 115/74 | HR 64 | Temp 98.7°F | Ht 77.0 in | Wt 218.0 lb

## 2019-09-07 DIAGNOSIS — S91002A Unspecified open wound, left ankle, initial encounter: Secondary | ICD-10-CM | POA: Diagnosis not present

## 2019-09-07 NOTE — Progress Notes (Signed)
Referring Provider Renette Butters, MD [address] [place],  [place] [id number]   CC: No chief complaint on file.     Phillip Hamilton is an 73 y.o. male.  HPI: Patient is here today today presenting with his wife for a left ankle wound.  He had an accident about 10 days ago where a heavy piece of farm equipment fell on his left ankle.  He was taken to our trauma center where irrigation debridement and closure was performed.  Since then it appears some tissue superior to the wound is turning black and declaring itself.  His initial surgeon, Dr. Percell Miller, as recommended he see [place] for management of the wound.  He has been keeping it elevated and dressing it with Adaptic.  He has not taken a shower or got it wet.  He has no fevers or chills.  He is a diabetic.  No Known Allergies  Outpatient Encounter Medications as of 09/07/2019  Medication Sig  . acetaminophen (TYLENOL) 500 MG tablet Take 2 tablets (1,000 mg total) by mouth every 8 (eight) hours for 10 days. For Pain.  Marland Kitchen aspirin EC 81 MG tablet Take 1 tablet (81 mg total) by mouth 2 (two) times daily. For DVT prophylaxis for 30 days after surgery.  Marland Kitchen atorvastatin (LIPITOR) 40 MG tablet Take 20 mg by mouth at bedtime.  . baclofen (LIORESAL) 10 MG tablet Take 1 tablet (10 mg total) by mouth 2 (two) times daily as needed for muscle spasms.  . celecoxib (CELEBREX) 100 MG capsule Take 1 capsule (100 mg total) by mouth 2 (two) times daily for 14 days. (Patient not taking: Reported on 09/07/2019)  . cephALEXin (KEFLEX) 500 MG capsule Take 1 capsule (500 mg total) by mouth 3 (three) times daily for 10 days.  Marland Kitchen docusate sodium (COLACE) 100 MG capsule Take 100 mg by mouth at bedtime.  Marland Kitchen glimepiride (AMARYL) 2 MG tablet Take 2 mg by mouth every morning.  Marland Kitchen lisinopril (ZESTRIL) 40 MG tablet Take 20 mg by mouth every morning.  . metFORMIN (GLUCOPHAGE) 500 MG tablet Take 500 mg by mouth 2 (two) times daily with a meal.  . omeprazole  (PRILOSEC) 20 MG capsule Take 20 mg by mouth 2 (two) times daily.  . ondansetron (ZOFRAN) 4 MG tablet Take 1 tablet (4 mg total) by mouth every 8 (eight) hours as needed for nausea or vomiting.  Marland Kitchen oxyCODONE (ROXICODONE) 5 MG immediate release tablet Take 1 tablet (5 mg total) by mouth every 6 (six) hours as needed for severe pain or breakthrough pain.  . vitamin B-12 (CYANOCOBALAMIN) 1000 MCG tablet Take 1,000 mcg by mouth every morning.   No facility-administered encounter medications on file as of 09/07/2019.      Past Medical History:  Diagnosis Date  . Diabetes mellitus without complication ([facility])   . Dyspnea   . Hypertension     Past Surgical History:  Procedure Laterality Date  . I&D EXTREMITY Left 08/28/2019   Procedure: IRRIGATION AND DEBRIDEMENT LEFT ANKLE;  COMPLEX WOUND REPAIR LEFT ANKLE;  Surgeon: Renette Butters, MD;  Location: [facility];  Service: Orthopedics;  Laterality: Left;    No family history on file.  Social History   Social History Narrative  . Not on file     Review of Systems General: Denies fever, chills, weight loss CV: Denies chest pain, shortness of breath, palpitations  Physical Exam Vitals with BMI 09/07/2019 08/28/2019 08/28/2019  Height 6\' 5"  - -  Weight  218 lbs - -  BMI 25.85 - -  Systolic 115 164 631  Diastolic 74 96 97  Pulse 64 82 82    General:  No acute distress,  Alert and oriented, Non-Toxic, Normal speech and affect Examination of the left lower extremity shows a combination sharp and a avulsion injury to the left medial lower extremity and ankle.  Sensation to the entire foot appears to be intact.  There is a palpable DP pulse.  He has good range of motion in all toes.  There is around 10 cm transverse laceration just superior to the medial malleolus.  This is well approximated with mattress sutures.  Extending superiorly there is a avulsion injury and it appears some of the tissue was necrosis.  There is some mild surrounding erythema.   There is no fluctuance or purulence.  Assessment/Plan Patient presenting with a leg and left ankle wound about a week and a half out from debridement and closure by Dr. Eulah Pont.  At this point some tissue superior to the wound is demarcating probably from the initial avulsion.  At this point my hope would be with time and wound care but this will heal on its own.  I did explain to him that he is high risk due to his diabetes but this seems to be reasonably well controlled.  I recommended bacitracin and Xeroform and washing the wound with soap and water daily.  I plan to see him next week and likely remove some of his sutures and reevaluate the wound.  I did explain there is a possibility he may need a debridement in the future but we will see how he does with wound care first.  Phillip Hamilton 09/07/2019, 12:52 PM

## 2019-09-07 NOTE — Telephone Encounter (Signed)
Received call from patient's wife requesting call back from [place], [place] regarding instructions on applying ointment. Ok to leave detailed voicemail: [phone].

## 2019-09-08 NOTE — Telephone Encounter (Signed)
Call back to pt's wife- regarding her questions about the dressing changes I advised her to change dressing daily- using bacitracin ointment,xeroform, 4x4 gauze, ABD pads & ace wraps- per Dr. Claudia Desanctis orders She is also aware that we have faxed orders for these supplies to [facility] to be delivered to their home in [place], [place] They will call for any concerns [place]

## 2019-09-12 ENCOUNTER — Institutional Professional Consult (permissible substitution): Payer: No Typology Code available for payment source | Admitting: Plastic Surgery

## 2019-09-14 ENCOUNTER — Ambulatory Visit (INDEPENDENT_AMBULATORY_CARE_PROVIDER_SITE_OTHER): Payer: Medicare Other | Admitting: Plastic Surgery

## 2019-09-14 ENCOUNTER — Encounter: Payer: Self-pay | Admitting: Plastic Surgery

## 2019-09-14 ENCOUNTER — Other Ambulatory Visit: Payer: Self-pay

## 2019-09-14 VITALS — BP 124/77 | HR 67 | Temp 97.8°F | Ht 77.0 in

## 2019-09-14 DIAGNOSIS — S91002A Unspecified open wound, left ankle, initial encounter: Secondary | ICD-10-CM | POA: Diagnosis not present

## 2019-09-14 NOTE — H&P (View-Only) (Signed)
   Referring Provider No referring provider defined for this encounter.   CC:  Chief Complaint  Patient presents with  . Follow-up    for (L) leg and ankle wound      Phillip Hamilton is an 73 y.o. male.  HPI: Patient presented for 1 week follow-up for a wound on his left ankle.  He is wife feels like things are improving but have a lot of questions about other potential treatment modalities like hyperbaric oxygen.  Patient has had no fevers and seems to be able to handle the wound care with bacitracin and Xeroform and a wrap.  He has a boot that he wears as he walks around that immobilizes his ankle.  He keeps his leg elevated the majority of the time.  Review of Systems General: Denies fevers, chills, weight loss  Physical Exam Vitals with BMI 09/14/2019 09/07/2019 08/28/2019  Height 6' 5" 6' 5" -  Weight - 218 lbs -  BMI - 25.85 -  Systolic 124 115 164  Diastolic 77 74 96  Pulse 67 64 82    General:  No acute distress,  Alert and oriented, Non-Toxic, Normal speech and affect Examination of the left lower extremity shows an improving wound.  There is still some areas of demarcation along his medial leg extending superiorly.  These have gotten smaller as has the area of erythema surrounding it.  There is some mild swelling distal to the wound but this has improved.  Assessment/Plan Overall I think things are going fine.  We will continue with the current wound care regimen.  I answered all her questions and removed a few of the sutures at this visit.  We will plan to see him again in a week.  Chenae Brager S Izea Livolsi 09/14/2019, 12:24 PM        

## 2019-09-14 NOTE — Progress Notes (Signed)
   Referring Provider No referring provider defined for this encounter.   CC:  Chief Complaint  Patient presents with  . Follow-up    for (L) leg and ankle wound      Phillip Hamilton is an 73 y.o. male.  HPI: Patient presented for 1 week follow-up for a wound on his left ankle.  He is wife feels like things are improving but have a lot of questions about other potential treatment modalities like hyperbaric oxygen.  Patient has had no fevers and seems to be able to handle the wound care with bacitracin and Xeroform and a wrap.  He has a boot that he wears as he walks around that immobilizes his ankle.  He keeps his leg elevated the majority of the time.  Review of Systems General: Denies fevers, chills, weight loss  Physical Exam Vitals with BMI 09/14/2019 09/07/2019 08/28/2019  Height 6\' 5"  6\' 5"  -  Weight - 218 lbs -  BMI - 99.83 -  Systolic 382 505 397  Diastolic 77 74 96  Pulse 67 64 82    General:  No acute distress,  Alert and oriented, Non-Toxic, Normal speech and affect Examination of the left lower extremity shows an improving wound.  There is still some areas of demarcation along his medial leg extending superiorly.  These have gotten smaller as has the area of erythema surrounding it.  There is some mild swelling distal to the wound but this has improved.  Assessment/Plan Overall I think things are going fine.  We will continue with the current wound care regimen.  I answered all her questions and removed a few of the sutures at this visit.  We will plan to see him again in a week.  Phillip Hamilton 09/14/2019, 12:24 PM

## 2019-09-21 ENCOUNTER — Other Ambulatory Visit: Payer: Self-pay

## 2019-09-21 ENCOUNTER — Encounter: Payer: Self-pay | Admitting: Plastic Surgery

## 2019-09-21 ENCOUNTER — Ambulatory Visit (INDEPENDENT_AMBULATORY_CARE_PROVIDER_SITE_OTHER): Payer: Medicare Other | Admitting: Plastic Surgery

## 2019-09-21 VITALS — BP 109/70 | HR 64 | Temp 97.5°F

## 2019-09-21 DIAGNOSIS — S91002A Unspecified open wound, left ankle, initial encounter: Secondary | ICD-10-CM | POA: Diagnosis not present

## 2019-09-21 NOTE — Progress Notes (Signed)
Referring Provider No referring provider defined for this encounter.   CC:  Chief Complaint  Patient presents with  . Follow-up    1 week for wound care on (L) ankle      Phillip Hamilton is an 73 y.o. male.  HPI: Patient is here for follow-up of the left ankle wound.  He feels like his pain is getting better.  His wife has a lot of questions regarding the appearance of the wound.  She is noted a mild odor.  No Known Allergies  Outpatient Encounter Medications as of 09/21/2019  Medication Sig  . acetaminophen (TYLENOL) 500 MG tablet Take 500 mg by mouth every 8 (eight) hours as needed.  Marland Kitchen aspirin EC 81 MG tablet Take 1 tablet (81 mg total) by mouth 2 (two) times daily. For DVT prophylaxis for 30 days after surgery.  Marland Kitchen atorvastatin (LIPITOR) 40 MG tablet Take 20 mg by mouth at bedtime.  . docusate sodium (COLACE) 100 MG capsule Take 100 mg by mouth at bedtime.  Marland Kitchen glimepiride (AMARYL) 2 MG tablet Take 2 mg by mouth every morning.  Marland Kitchen lisinopril (ZESTRIL) 40 MG tablet Take 20 mg by mouth every morning.  . metFORMIN (GLUCOPHAGE) 1000 MG tablet Take 1,000 mg by mouth 2 (two) times daily with a meal.  . omeprazole (PRILOSEC) 20 MG capsule Take 20 mg by mouth 2 (two) times daily.  . vitamin B-12 (CYANOCOBALAMIN) 1000 MCG tablet Take 1,000 mcg by mouth every morning.  . [DISCONTINUED] metFORMIN (GLUCOPHAGE) 500 MG tablet Take 1,000 mg by mouth 2 (two) times daily with a meal.    No facility-administered encounter medications on file as of 09/21/2019.      Past Medical History:  Diagnosis Date  . Diabetes mellitus without complication ([facility])   . Dyspnea   . Hypertension     Past Surgical History:  Procedure Laterality Date  . I&D EXTREMITY Left 08/28/2019   Procedure: IRRIGATION AND DEBRIDEMENT LEFT ANKLE;  COMPLEX WOUND REPAIR LEFT ANKLE;  Surgeon: Sheral Apley, MD;  Location: [facility];  Service: Orthopedics;  Laterality: Left;    No family history on file.  Social History    Social History Narrative  . Not on file     Review of Systems General: Denies fevers, chills, weight loss CV: Denies chest pain, shortness of breath, palpitations  Physical Exam Vitals with BMI 09/21/2019 09/14/2019 09/07/2019  Height - 6\' 5"  6\' 5"   Weight - - 218 lbs  BMI - - 25.85  Systolic 109 124  Diastolic 70 77 74  Pulse 64 67 64    General:  No acute distress,  Alert and oriented, Non-Toxic, Normal speech and affect Cardiovascular: Regular rhythm Respiratory: Unlabored Left ankle: Left ankle shows clear demarcation now with an area of full-thickness skin necrosis.  Surrounding erythema has improved significantly.  He has mild swelling distally.  The full-thickness wound area is probably 15 x 8 cm approximately  Assessment/Plan Patient continues to make progress with his left ankle wound.  However at this visit I feel the full-thickness skin loss has clearly demarcated.  In this regard I have advised them that in my opinion a debridement would speed up his recovery process.  I explained I would debride the necrotic skin and place a cell over the wound.  This should speed up his healing process and decrease his chances of infection by removing the necrotic tissue.  We will plan to do this soon as possible.   09/21/2019, 3:40 PM

## 2019-09-22 ENCOUNTER — Encounter (HOSPITAL_COMMUNITY): Payer: Self-pay | Admitting: *Deleted

## 2019-09-22 ENCOUNTER — Other Ambulatory Visit (HOSPITAL_COMMUNITY)
Admission: RE | Admit: 2019-09-22 | Discharge: 2019-09-22 | Disposition: A | Discharge status: Home / Self Care | Payer: Medicare Other | Source: Ambulatory Visit | Attending: Plastic Surgery | Admitting: Plastic Surgery

## 2019-09-22 ENCOUNTER — Other Ambulatory Visit (HOSPITAL_COMMUNITY): Payer: Medicare Other

## 2019-09-22 ENCOUNTER — Other Ambulatory Visit: Payer: Self-pay

## 2019-09-22 DIAGNOSIS — Z01812 Encounter for preprocedural laboratory examination: Secondary | ICD-10-CM | POA: Diagnosis present

## 2019-09-22 DIAGNOSIS — Z20828 Contact with and (suspected) exposure to other viral communicable diseases: Secondary | ICD-10-CM | POA: Insufficient documentation

## 2019-09-22 LAB — SARS CORONAVIRUS 2 (TAT 6-24 HRS): SARS Coronavirus 2: NEGATIVE

## 2019-09-22 NOTE — Progress Notes (Signed)
Spoke with pt and his wife, Phillip Hamilton for pre-op call. Pt states years ago he had a cath and was told he had a small blockage. States that he's been told that it has gotten smaller over the years. He no longer sees a cardiologist. Denies any recent chest pain or sob. Pt is a type 2 diabetic. Last A1C was 6.8 in August. Pt states he does not check his blood sugar at home.  Pt has PTSD and he requests that his wife be in the pre-op area with him. I told him that decision would be made on Monday. He voiced understanding.  Pt had his Covid test done today and states he's been in quarantine since and will continue to do so until day of surgery.

## 2019-09-25 ENCOUNTER — Other Ambulatory Visit: Payer: Self-pay

## 2019-09-25 ENCOUNTER — Encounter (HOSPITAL_COMMUNITY)
Admission: RE | Disposition: A | Discharge status: Home / Self Care | Payer: Self-pay | Source: Home / Self Care | Attending: Plastic Surgery

## 2019-09-25 ENCOUNTER — Encounter (HOSPITAL_COMMUNITY): Payer: Self-pay

## 2019-09-25 ENCOUNTER — Ambulatory Visit (HOSPITAL_COMMUNITY): Payer: Medicare Other | Admitting: Certified Registered Nurse Anesthetist

## 2019-09-25 ENCOUNTER — Ambulatory Visit (HOSPITAL_COMMUNITY)
Admission: RE | Admit: 2019-09-25 | Discharge: 2019-09-25 | Disposition: A | Discharge status: Home / Self Care | Payer: Medicare Other | Attending: Plastic Surgery | Admitting: Plastic Surgery

## 2019-09-25 DIAGNOSIS — E119 Type 2 diabetes mellitus without complications: Secondary | ICD-10-CM | POA: Diagnosis not present

## 2019-09-25 DIAGNOSIS — K219 Gastro-esophageal reflux disease without esophagitis: Secondary | ICD-10-CM | POA: Insufficient documentation

## 2019-09-25 DIAGNOSIS — J45909 Unspecified asthma, uncomplicated: Secondary | ICD-10-CM | POA: Insufficient documentation

## 2019-09-25 DIAGNOSIS — Z87891 Personal history of nicotine dependence: Secondary | ICD-10-CM | POA: Diagnosis not present

## 2019-09-25 DIAGNOSIS — X58XXXA Exposure to other specified factors, initial encounter: Secondary | ICD-10-CM | POA: Diagnosis not present

## 2019-09-25 DIAGNOSIS — I10 Essential (primary) hypertension: Secondary | ICD-10-CM | POA: Insufficient documentation

## 2019-09-25 DIAGNOSIS — S91002A Unspecified open wound, left ankle, initial encounter: Secondary | ICD-10-CM | POA: Insufficient documentation

## 2019-09-25 DIAGNOSIS — Y9279 Other farm location as the place of occurrence of the external cause: Secondary | ICD-10-CM | POA: Insufficient documentation

## 2019-09-25 DIAGNOSIS — S81802A Unspecified open wound, left lower leg, initial encounter: Secondary | ICD-10-CM

## 2019-09-25 HISTORY — DX: Gastro-esophageal reflux disease without esophagitis: K21.9

## 2019-09-25 HISTORY — DX: Post-traumatic stress disorder, unspecified: F43.10

## 2019-09-25 HISTORY — PX: I & D EXTREMITY: SHX5045

## 2019-09-25 HISTORY — DX: Unspecified asthma, uncomplicated: J45.909

## 2019-09-25 LAB — BASIC METABOLIC PANEL
Anion gap: 11 (ref 5–15)
BUN: 14 mg/dL (ref 8–23)
CO2: 21 mmol/L — ABNORMAL LOW (ref 22–32)
Calcium: 9 mg/dL (ref 8.9–10.3)
Chloride: 106 mmol/L (ref 98–111)
Creatinine, Ser: 0.83 mg/dL (ref 0.61–1.24)
GFR calc Af Amer: >60 mL/min (ref >60)
GFR calc non Af Amer: >60 mL/min (ref >60)
Glucose, Bld: 163 mg/dL — ABNORMAL HIGH (ref 70–99)
Potassium: 4.3 mmol/L (ref 3.5–5.1)
Sodium: 138 mmol/L (ref 135–145)

## 2019-09-25 LAB — CBC
HCT: 43.1 % (ref 39.0–52.0)
Hemoglobin: 14.1 g/dL (ref 13.0–17.0)
MCH: 31.8 pg (ref 26.0–34.0)
MCHC: 32.7 g/dL (ref 30.0–36.0)
MCV: 97.3 fL (ref 80.0–100.0)
Platelets: 237 10*3/uL (ref 150–400)
RBC: 4.43 MIL/uL (ref 4.22–5.81)
RDW: 13.1 % (ref 11.5–15.5)
WBC: 5.6 10*3/uL (ref 4.0–10.5)
nRBC: 0 % (ref 0.0–0.2)

## 2019-09-25 LAB — GLUCOSE, CAPILLARY
Glucose-Capillary: 153 mg/dL — ABNORMAL HIGH (ref 70–99)
Glucose-Capillary: 132 mg/dL — ABNORMAL HIGH (ref 70–99)

## 2019-09-25 SURGERY — IRRIGATION AND DEBRIDEMENT EXTREMITY
Anesthesia: General | Site: Ankle | Laterality: Left

## 2019-09-25 MED ORDER — MIDAZOLAM HCL 2 MG/2ML IJ SOLN
INTRAMUSCULAR | Status: AC
  Filled 2019-09-25: qty 2

## 2019-09-25 MED ORDER — FENTANYL CITRATE (PF) 250 MCG/5ML IJ SOLN
INTRAMUSCULAR | Status: DC | PRN
  Administered 2019-09-25 (×5): 25 ug via INTRAVENOUS

## 2019-09-25 MED ORDER — BUPIVACAINE HCL (PF) 0.25 % IJ SOLN
INTRAMUSCULAR | Status: AC
  Filled 2019-09-25: qty 30

## 2019-09-25 MED ORDER — OXYCODONE HCL 5 MG PO TABS
5.0000 mg | ORAL_TABLET | Freq: Once | ORAL | Status: AC | PRN
  Administered 2019-09-25: 5 mg via ORAL

## 2019-09-25 MED ORDER — OXYCODONE HCL 5 MG PO TABS
ORAL_TABLET | ORAL | Status: AC
  Filled 2019-09-25: qty 1

## 2019-09-25 MED ORDER — LIDOCAINE 2% (20 MG/ML) 5 ML SYRINGE
INTRAMUSCULAR | Status: DC | PRN
  Administered 2019-09-25: 60 mg via INTRAVENOUS

## 2019-09-25 MED ORDER — 0.9 % SODIUM CHLORIDE (POUR BTL) OPTIME
TOPICAL | Status: DC | PRN
  Administered 2019-09-25: 1000 mL

## 2019-09-25 MED ORDER — CEFAZOLIN SODIUM-DEXTROSE 2-4 GM/100ML-% IV SOLN
INTRAVENOUS | Status: AC
  Filled 2019-09-25: qty 100

## 2019-09-25 MED ORDER — PROPOFOL 10 MG/ML IV BOLUS
INTRAVENOUS | Status: AC
  Filled 2019-09-25: qty 20

## 2019-09-25 MED ORDER — OXYCODONE HCL 5 MG/5ML PO SOLN: 5.0000 mg | Freq: Once | ORAL | Status: AC | PRN

## 2019-09-25 MED ORDER — BUPIVACAINE-EPINEPHRINE (PF) 0.5% -1:200000 IJ SOLN
INTRAMUSCULAR | Status: DC | PRN
  Administered 2019-09-25: 30 mL

## 2019-09-25 MED ORDER — FENTANYL CITRATE (PF) 100 MCG/2ML IJ SOLN
25.0000 ug | INTRAMUSCULAR | Status: DC | PRN
  Administered 2019-09-25 (×2): 50 ug via INTRAVENOUS

## 2019-09-25 MED ORDER — FENTANYL CITRATE (PF) 100 MCG/2ML IJ SOLN
INTRAMUSCULAR | Status: AC
  Administered 2019-09-25: 50 ug via INTRAVENOUS
  Filled 2019-09-25: qty 2

## 2019-09-25 MED ORDER — EPHEDRINE SULFATE-NACL 50-0.9 MG/10ML-% IV SOSY
PREFILLED_SYRINGE | INTRAVENOUS | Status: DC | PRN
  Administered 2019-09-25: 5 mg via INTRAVENOUS

## 2019-09-25 MED ORDER — MIDAZOLAM HCL 5 MG/5ML IJ SOLN
INTRAMUSCULAR | Status: DC | PRN
  Administered 2019-09-25 (×2): 1 mg via INTRAVENOUS

## 2019-09-25 MED ORDER — LACTATED RINGERS IV SOLN
INTRAVENOUS | Status: DC
  Administered 2019-09-25: 10:00:00 via INTRAVENOUS

## 2019-09-25 MED ORDER — FENTANYL CITRATE (PF) 250 MCG/5ML IJ SOLN
INTRAMUSCULAR | Status: AC
  Filled 2019-09-25: qty 5

## 2019-09-25 MED ORDER — CEFAZOLIN SODIUM-DEXTROSE 2-4 GM/100ML-% IV SOLN
2.0000 g | INTRAVENOUS | Status: AC
  Administered 2019-09-25: 2 g via INTRAVENOUS

## 2019-09-25 MED ORDER — PROPOFOL 10 MG/ML IV BOLUS
INTRAVENOUS | Status: DC | PRN
  Administered 2019-09-25: 200 mg via INTRAVENOUS

## 2019-09-25 MED ORDER — DEXAMETHASONE SODIUM PHOSPHATE 10 MG/ML IJ SOLN
INTRAMUSCULAR | Status: AC
  Filled 2019-09-25: qty 1

## 2019-09-25 MED ORDER — BUPIVACAINE-EPINEPHRINE 0.5% -1:200000 IJ SOLN
INTRAMUSCULAR | Status: AC
  Filled 2019-09-25: qty 1

## 2019-09-25 MED ORDER — DEXAMETHASONE SODIUM PHOSPHATE 10 MG/ML IJ SOLN
INTRAMUSCULAR | Status: DC | PRN
  Administered 2019-09-25: 5 mg via INTRAVENOUS

## 2019-09-25 MED ORDER — ONDANSETRON HCL 4 MG/2ML IJ SOLN
INTRAMUSCULAR | Status: DC | PRN
  Administered 2019-09-25: 4 mg via INTRAVENOUS

## 2019-09-25 MED ORDER — ONDANSETRON HCL 4 MG/2ML IJ SOLN: 4.0000 mg | Freq: Four times a day (QID) | INTRAMUSCULAR | Status: DC | PRN

## 2019-09-25 SURGICAL SUPPLY — 46 items
BLADE CLIPPER SURG (BLADE) IMPLANT
BNDG ELASTIC 4X5.8 VLCR STR LF (GAUZE/BANDAGES/DRESSINGS) ×2 IMPLANT
BNDG GAUZE ELAST 4 BULKY (GAUZE/BANDAGES/DRESSINGS) ×4 IMPLANT
CANISTER SUCT 3000ML PPV (MISCELLANEOUS) ×2 IMPLANT
CHLORAPREP W/TINT 26 (MISCELLANEOUS) IMPLANT
COVER SURGICAL LIGHT HANDLE (MISCELLANEOUS) ×2 IMPLANT
COVER WAND RF STERILE (DRAPES) ×2 IMPLANT
DRAPE HALF SHEET 40X57 (DRAPES) IMPLANT
DRAPE INCISE IOBAN 66X45 STRL (DRAPES) IMPLANT
DRAPE ORTHO SPLIT 77X108 STRL (DRAPES)
DRAPE SURG ORHT 6 SPLT 77X108 (DRAPES) IMPLANT
DRESSING HYDROCOLLOID 4X4 (GAUZE/BANDAGES/DRESSINGS) IMPLANT
DRSG ADAPTIC 3X8 NADH LF (GAUZE/BANDAGES/DRESSINGS) ×2 IMPLANT
DRSG CUTIMED SORBACT 7X9 (GAUZE/BANDAGES/DRESSINGS) ×2 IMPLANT
DRSG EMULSION OIL 3X3 NADH (GAUZE/BANDAGES/DRESSINGS) ×2 IMPLANT
DRSG PAD ABDOMINAL 8X10 ST (GAUZE/BANDAGES/DRESSINGS) IMPLANT
DRSG VAC ATS LRG SENSATRAC (GAUZE/BANDAGES/DRESSINGS) IMPLANT
DRSG VAC ATS MED SENSATRAC (GAUZE/BANDAGES/DRESSINGS) IMPLANT
DRSG VAC ATS SM SENSATRAC (GAUZE/BANDAGES/DRESSINGS) IMPLANT
ELECT REM PT RETURN 9FT ADLT (ELECTROSURGICAL) ×2
ELECTRODE REM PT RTRN 9FT ADLT (ELECTROSURGICAL) ×1 IMPLANT
GAUZE SPONGE 4X4 12PLY STRL (GAUZE/BANDAGES/DRESSINGS) ×4 IMPLANT
GEL ULTRASOUND 20GR AQUASONIC (MISCELLANEOUS) IMPLANT
GLOVE BIO SURGEON STRL SZ 6.5 (GLOVE) ×2 IMPLANT
GLOVE BIO SURGEON STRL SZ7.5 (GLOVE) ×2 IMPLANT
GOWN STRL REUS W/ TWL LRG LVL3 (GOWN DISPOSABLE) ×2 IMPLANT
GOWN STRL REUS W/TWL LRG LVL3 (GOWN DISPOSABLE) ×4
HANDPIECE INTERPULSE COAX TIP (DISPOSABLE)
KIT BASIN OR (CUSTOM PROCEDURE TRAY) ×2 IMPLANT
KIT TURNOVER KIT B (KITS) ×2 IMPLANT
MATRIX WOUND 3-LAYER 7X10 (Tissue) ×2 IMPLANT
MICROMATRIX 1000MG (Tissue) ×2 IMPLANT
NS IRRIG 1000ML POUR BTL (IV SOLUTION) ×2 IMPLANT
PACK GENERAL/GYN (CUSTOM PROCEDURE TRAY) ×2 IMPLANT
PAD ARMBOARD 7.5X6 YLW CONV (MISCELLANEOUS) ×4 IMPLANT
PAD NEG PRESSURE SENSATRAC (MISCELLANEOUS) IMPLANT
SET HNDPC FAN SPRY TIP SCT (DISPOSABLE) IMPLANT
SOLUTION PARTIC MCRMTRX 1000MG (Tissue) ×1 IMPLANT
SURGILUBE 2OZ TUBE FLIPTOP (MISCELLANEOUS) ×2 IMPLANT
SUT ETHILON 3 0 PS 1 (SUTURE) ×4 IMPLANT
SUT ETHILON 4 0 P 3 18 (SUTURE) ×2 IMPLANT
SUT SILK 4 0 PS 2 (SUTURE) IMPLANT
SUT VIC AB 5-0 PS2 18 (SUTURE) ×2 IMPLANT
TOWEL GREEN STERILE (TOWEL DISPOSABLE) ×2 IMPLANT
TOWEL GREEN STERILE FF (TOWEL DISPOSABLE) IMPLANT
UNDERPAD 30X30 (UNDERPADS AND DIAPERS) IMPLANT

## 2019-09-25 NOTE — Anesthesia Preprocedure Evaluation (Signed)
Anesthesia Evaluation  Patient identified by MRN, date of birth, ID band Patient awake    Reviewed: Allergy & Precautions, H&P , NPO status , Patient's Chart, lab work & pertinent test results  Airway Mallampati: II   Neck ROM: full    Dental   Pulmonary shortness of breath, asthma , former smoker,    breath sounds clear to auscultation       Cardiovascular hypertension,  Rhythm:regular Rate:Normal     Neuro/Psych PSYCHIATRIC DISORDERS Anxiety    GI/Hepatic GERD  ,  Endo/Other  diabetes, Type 2  Renal/GU      Musculoskeletal   Abdominal   Peds  Hematology   Anesthesia Other Findings   Reproductive/Obstetrics                             Anesthesia Physical Anesthesia Plan  ASA: II  Anesthesia Plan: General   Post-op Pain Management:    Induction: Intravenous  PONV Risk Score and Plan: 2 and Ondansetron, Dexamethasone and Treatment may vary due to age or medical condition  Airway Management Planned: LMA  Additional Equipment:   Intra-op Plan:   Post-operative Plan:   Informed Consent: I have reviewed the patients History and Physical, chart, labs and discussed the procedure including the risks, benefits and alternatives for the proposed anesthesia with the patient or authorized representative who has indicated his/her understanding and acceptance.       Plan Discussed with: CRNA, Anesthesiologist and Surgeon  Anesthesia Plan Comments:         Anesthesia Quick Evaluation

## 2019-09-25 NOTE — Op Note (Signed)
Operative Note   DATE OF OPERATION: 09/25/2019  SURGICAL DEPARTMENT: Plastic Surgery  PREOPERATIVE DIAGNOSES: Left lower extremity wound  POSTOPERATIVE DIAGNOSES:  same  PROCEDURE:   1.  Debridement of left lower extremity wound totaling 15 x 6 cm 2.  Complex closure left lower extremity wound totaling 12 cm in length 3.  Placement of 1 g of ACell micromatrix powder and 1 sheet of 3 layer Cytal totaling 7 x 10 cm  SURGEON: Talmadge Coventry, MD  ASSISTANT: None  ANESTHESIA:  General.   COMPLICATIONS: None.   INDICATIONS FOR PROCEDURE:  The patient, Phillip Hamilton is a 73 y.o. male born on November 16, 1946, is here for treatment of left lower extremity wound.  This was a result of a farming accident.  This was previously closed however some of the surrounding skin necrosis due to the initial blunt trauma.  Is here for debridement and wound management. MRN: [id number]  CONSENT:  Informed consent was obtained directly from the patient. Risks, benefits and alternatives were fully discussed. Specific risks including but not limited to bleeding, infection, hematoma, seroma, scarring, pain, contracture, asymmetry, wound healing problems, and need for further surgery were all discussed. The patient did have an ample opportunity to have questions answered to satisfaction.   DESCRIPTION OF PROCEDURE:  The patient was taken to the operating room. SCDs were placed and Ancef antibiotics were given.  General anesthesia was administered.  The patient's operative site was prepped and draped in a sterile fashion. A time out was performed and all information was confirmed to be correct.  Marcaine with epinephrine was injected circumferentially.  The wound was debrided sharply with a 15 blade.  This included skin subcutaneous tissue and some fascia.  The wound was irrigated copiously with saline.  There was some laxity in this area so the superior aspect of the wound was undermined circumferentially advanced  and closed in layers.  The skin was ultimately closed with 3-0 nylon mattress sutures.  The area that could not be closed was around the medial malleolus and the inferior portion of the wound.  This layer was covered with ACell micromatrix powder and a 3 layer sheet of Cytal.  All of the Cytal was used.  The ACell was sutured then with 5-0 Vicryl.  Sorbac was sutured over this with 4-0 nylon.  This was covered with K-Y jelly and 4 x 4's.  A soft dressing was then applied with Kerlix and an Ace.  The patient tolerated the procedure well.  There were no complications. The patient was allowed to wake from anesthesia, extubated and taken to the recovery room in satisfactory condition.

## 2019-09-25 NOTE — Discharge Instructions (Signed)
Activity As tolerated No heavy activities Gentle weight bearing on left leg  Diet:regular No restrictions:  Wound Care: Keep dressing clean & dry  Do not change dressings Call Doctor if any unusual problems occur such as pain, excessive Bleeding, unrelieved Nausea/vomiting, Fever &/or chills When lying down, keep leg elevated Follow-up appointment: Scheduled for next week

## 2019-09-25 NOTE — Transfer of Care (Signed)
Immediate Anesthesia Transfer of Care Note  Patient: Phillip Hamilton  Procedure(s) Performed: debridement left ankle wound with Acell placement (Left Ankle)  Patient Location: PACU  Anesthesia Type:General  Level of Consciousness: drowsy  Airway & Oxygen Therapy: Patient Spontanous Breathing and Patient connected to face mask oxygen  Post-op Assessment: Report given to RN and Post -op Vital signs reviewed and stable  Post vital signs: Reviewed  Last Vitals:  Vitals Value Taken Time  BP 143/86 09/25/19 1253  Temp    Pulse 85 09/25/19 1254  Resp 23 09/25/19 1254  SpO2 100 % 09/25/19 1254  Vitals shown include unvalidated device data.  Last Pain:  Vitals:   09/25/19 0914  PainSc: 3          Complications: No apparent anesthesia complications

## 2019-09-25 NOTE — Interval H&P Note (Signed)
History and Physical Interval Note:  09/25/2019 11:35 AM  Phillip Hamilton  has presented today for surgery, with the diagnosis of wound of lower extremity.  The various methods of treatment have been discussed with the patient and family. After consideration of risks, benefits and other options for treatment, the patient has consented to  Procedure(s): debridement left ankle wound with Acell placement (Left) as a surgical intervention.  The patient's history has been reviewed, patient examined, no change in status, stable for surgery.  I have reviewed the patient's chart and labs.  Questions were answered to the patient's satisfaction.     Cindra Presume

## 2019-09-25 NOTE — Anesthesia Procedure Notes (Addendum)
Procedure Name: LMA Insertion Date/Time: 09/25/2019 11:49 AM Performed by: Janene Harvey, CRNA Pre-anesthesia Checklist: Patient identified, Emergency Drugs available, Suction available and Patient being monitored Patient Re-evaluated:Patient Re-evaluated prior to induction Oxygen Delivery Method: Circle system utilized Preoxygenation: Pre-oxygenation with 100% oxygen Induction Type: IV induction LMA: LMA inserted LMA Size: 4.0 Number of attempts: 1 Dental Injury: Teeth and Oropharynx as per pre-operative assessment

## 2019-09-25 NOTE — Brief Op Note (Signed)
09/25/2019  12:54 PM  PATIENT:  Tamala Bari  73 y.o. male  PRE-OPERATIVE DIAGNOSIS:  wound of lower extremity  POST-OPERATIVE DIAGNOSIS:  wound of lower extremity  PROCEDURE:  Procedure(s): debridement left ankle wound with Acell placement (Left)  SURGEON:  Surgeon(s) and Role:    * Shayn Madole, Steffanie Dunn, MD - Primary  PHYSICIAN ASSISTANT:   ASSISTANTS: none   ANESTHESIA:   general  EBL:  25 mL   BLOOD ADMINISTERED:none  DRAINS: none   LOCAL MEDICATIONS USED:  MARCAINE     SPECIMEN:  No Specimen  DISPOSITION OF SPECIMEN:  N/A  COUNTS:  YES  TOURNIQUET:  * No tourniquets in log *  DICTATION: .Dragon Dictation  PLAN OF CARE: Discharge to home after PACU  PATIENT DISPOSITION:  PACU - hemodynamically stable.   Delay start of Pharmacological VTE agent (>24hrs) due to surgical blood loss or risk of bleeding: not applicable

## 2019-09-26 ENCOUNTER — Encounter (HOSPITAL_COMMUNITY): Payer: Self-pay | Admitting: Plastic Surgery

## 2019-09-26 NOTE — Anesthesia Postprocedure Evaluation (Addendum)
Anesthesia Post Note  Patient: Phillip Hamilton  Procedure(s) Performed: debridement left ankle wound with Acell placement (Left Ankle)     Patient location during evaluation: PACU Anesthesia Type: General Level of consciousness: awake and alert Pain management: pain level controlled Vital Signs Assessment: post-procedure vital signs reviewed and stable Respiratory status: spontaneous breathing, nonlabored ventilation, respiratory function stable and patient connected to nasal cannula oxygen Cardiovascular status: stable and blood pressure returned to baseline Postop Assessment: no apparent nausea or vomiting Anesthetic complications: no    Last Vitals:  Vitals:   09/25/19 1345 09/25/19 1355  BP:  133/84  Pulse: 62 62  Resp: 20 18  Temp:    SpO2: 99% 100%    Last Pain:  Vitals:   09/25/19 1355  PainSc: 2                  Emalee Knies S

## 2019-09-27 ENCOUNTER — Ambulatory Visit (INDEPENDENT_AMBULATORY_CARE_PROVIDER_SITE_OTHER): Payer: Medicare Other | Admitting: Plastic Surgery

## 2019-09-27 ENCOUNTER — Encounter: Payer: Self-pay | Admitting: Plastic Surgery

## 2019-09-27 ENCOUNTER — Other Ambulatory Visit: Payer: Self-pay

## 2019-09-27 VITALS — BP 117/73 | HR 65 | Temp 97.3°F

## 2019-09-27 DIAGNOSIS — S91002A Unspecified open wound, left ankle, initial encounter: Secondary | ICD-10-CM

## 2019-09-27 NOTE — Progress Notes (Signed)
Called last night concerned w wound dressing.  Had some drainage.  He slipped off of his knee scooter yesterday and fell on the ball of his foot causing dorsiflexion.  He reported some swelling that has since improved.  The dressing was taken down today and everything looks fine.  The wound edges are still approximated where I could close them.  The sorbac is intact over the Cytol sheet.  There is mild surrounding erythema consistent with his operation 2 days ago.  No purulence.  Overall I think this looks fine I redressed it and we will plan to see him back next week.

## 2019-09-28 ENCOUNTER — Encounter: Payer: Self-pay | Admitting: *Deleted

## 2019-09-28 ENCOUNTER — Telehealth: Payer: Self-pay | Admitting: *Deleted

## 2019-09-28 DIAGNOSIS — S91002A Unspecified open wound, left ankle, initial encounter: Secondary | ICD-10-CM | POA: Insufficient documentation

## 2019-09-28 NOTE — Telephone Encounter (Signed)
Faxed order to [facility] for supplies to be mailed to the patient's home.  Confirmation received and patient aware.//AB/CMA

## 2019-10-04 ENCOUNTER — Other Ambulatory Visit: Payer: Self-pay

## 2019-10-04 ENCOUNTER — Encounter: Payer: Self-pay | Admitting: Plastic Surgery

## 2019-10-04 ENCOUNTER — Ambulatory Visit (INDEPENDENT_AMBULATORY_CARE_PROVIDER_SITE_OTHER): Payer: Medicare Other | Admitting: Plastic Surgery

## 2019-10-04 VITALS — BP 126/74 | HR 66 | Temp 97.1°F

## 2019-10-04 DIAGNOSIS — S91002A Unspecified open wound, left ankle, initial encounter: Secondary | ICD-10-CM

## 2019-10-04 NOTE — Progress Notes (Signed)
Patient is now 2-week postop from closure of ankle wound and placement of ACell matrix on the remainder.  They have had issues with the smell which is the main issue at this point.  On exam the portion of the wound that was close look like it is healing appropriately.  The sore back dressing was removed revealing largely incorporating ACell matrix.  Adaptic was applied followed by K-Y jelly and a soft wrap.  They are going out of town for 2 weeks and will see me when they return.  I have given them clear wound clinic care instructions.

## 2019-10-19 ENCOUNTER — Ambulatory Visit: Payer: Medicare Other | Admitting: Plastic Surgery

## 2019-10-19 ENCOUNTER — Other Ambulatory Visit: Payer: Self-pay

## 2019-10-19 ENCOUNTER — Ambulatory Visit (INDEPENDENT_AMBULATORY_CARE_PROVIDER_SITE_OTHER): Payer: Medicare Other | Admitting: Plastic Surgery

## 2019-10-19 VITALS — BP 129/78 | HR 69 | Temp 98.5°F

## 2019-10-19 DIAGNOSIS — S91002A Unspecified open wound, left ankle, initial encounter: Secondary | ICD-10-CM

## 2019-10-19 NOTE — Progress Notes (Signed)
Patient is here postop for a left ankle wound.  He is doing well and feels like things are progressing.  On exam the wound is contracting in majority of the ACell has incorporated.  There is some small areas that still have fibrinous exudate but the smell is much better and the wound looks to be progressing.  We will plan to continue doing wound care as he is doing now and follow-up with me again in 2 weeks.

## 2019-11-02 ENCOUNTER — Encounter: Payer: Self-pay | Admitting: Plastic Surgery

## 2019-11-02 ENCOUNTER — Ambulatory Visit (INDEPENDENT_AMBULATORY_CARE_PROVIDER_SITE_OTHER): Payer: Medicare Other | Admitting: Plastic Surgery

## 2019-11-02 ENCOUNTER — Other Ambulatory Visit: Payer: Self-pay

## 2019-11-02 VITALS — BP 117/80 | HR 74 | Temp 98.5°F

## 2019-11-02 DIAGNOSIS — S91002A Unspecified open wound, left ankle, initial encounter: Secondary | ICD-10-CM

## 2019-11-02 NOTE — Progress Notes (Signed)
   Referring Provider No referring provider defined for this encounter.   CC: No chief complaint on file. Left ankle wound  Phillip Hamilton is an 73 y.o. male.  HPI: Patient is doing well after debridement and ACell placement to left ankle wound.  He and his wife both feel like things are moving in the right direction and the wound is getting smaller.  He has less pain.  He still is limited getting around but is gradually doing more.  Review of Systems General: Denies fevers, chills, changes in weight  Physical Exam Vitals with BMI 11/02/2019 10/19/2019 10/04/2019  Height - - -  Weight - - -  BMI - - -  Systolic 149 702 637  Diastolic 80 78 74  Pulse 74 69 66    General:  No acute distress,  Alert and oriented, Non-Toxic, Normal speech and affect Overall the left ankle wound is looking much better.  Is quite a bit smaller and more superficial than before.  There is less fibrinous tissue.  The remaining open area is probably 3-1/2 x 2 and half centimeters in size and is healthy and granulating.  There is no surrounding fluctuance or purulence.  Assessment/Plan Overall things are going well.  We did discuss physical therapy for the patient in terms of increasing his strength and range of motion in his ankle but he declined.  We will plan to continue the wound care as is being done with daily changes.  We will see him again in 2 weeks.  Cindra Presume 11/02/2019, 2:15 PM

## 2019-11-16 ENCOUNTER — Other Ambulatory Visit: Payer: Self-pay

## 2019-11-16 ENCOUNTER — Encounter: Payer: Self-pay | Admitting: Plastic Surgery

## 2019-11-16 ENCOUNTER — Ambulatory Visit (INDEPENDENT_AMBULATORY_CARE_PROVIDER_SITE_OTHER): Payer: Medicare Other | Admitting: Plastic Surgery

## 2019-11-16 VITALS — BP 137/86 | HR 71 | Temp 98.3°F | Wt 218.0 lb

## 2019-11-16 DIAGNOSIS — S91002A Unspecified open wound, left ankle, initial encounter: Secondary | ICD-10-CM

## 2019-11-16 NOTE — Progress Notes (Signed)
   Referring Provider No referring provider defined for this encounter.   CC: No chief complaint on file. Left leg wound  Phillip Hamilton is an 73 y.o. male.  HPI: Patient presents for follow-up for his left leg wound.  This has been debrided with ACell placed.  Overall he is feeling better with more function of his leg and has been getting around more with less pain.  His wife has been doing the dressing changes and feels like things are progressing okay.  They both feel the wound is a bit smaller than previous  Review of Systems General: Denies fevers, chills, changes in weight  Physical Exam Vitals with BMI 11/16/2019 11/02/2019 10/19/2019  Height - - -  Weight 218 lbs - -  BMI - - -  Systolic 423 536 144  Diastolic 86 80 78  Pulse 71 74 69    General:  No acute distress,  Alert and oriented, Non-Toxic, Normal speech and affect Examination of the left ankle shows a granulating wound.  The wound bed itself is getting smaller as it contraction from the edges.  There is very mild swelling distally that appears unchanged to me.  There is no surrounding erythema or purulent drainage to suggest infection.  Assessment/Plan Patient presents for follow-up for his leg wound.  This seems to be healing reasonably well.  We will plan to continue the wound care as is being done and if it does not show significant signs of progress at the next visit we may consider a small debridement to speed it along.  For the meantime they are both content with staying the course with wound care.  We will plan to see him again in about 3 weeks.  Cindra Presume 11/16/2019, 10:22 AM

## 2019-11-22 ENCOUNTER — Telehealth: Payer: Self-pay | Admitting: Plastic Surgery

## 2019-11-22 NOTE — Telephone Encounter (Signed)
Wife, Hassan Rowan, called in to advise that she keeps getting the wrong supplies from the carrier and they told her they dont have an order for the size she is requesting. She would like for someone to call her back asap so they can get the supplies shipped out before Christmas. [phone]

## 2019-11-22 NOTE — Telephone Encounter (Signed)
Called and spoke with the patient's wife to see what size supplies the patient needed. She stated that the patient's wound has gotten smaller, and she needs the smaller Adaptic and non-adhesive pads.  She said she did not want to cut the Adaptic in half because it was sterile.  She stated that she called and spoke with someone with [employer] and she explained to them that the supplies were to big and she would like to get it smaller.  They informed her that they will need a new order.  Called [employer] and spoke with the representative regarding the different sizes for the Adaptic and the non-adhesive pads.  I was given the best size for a small wound.  A new order was filled out and faxed.  Confirmation was received.  Called and spoke with the patient's wife and informed her that a new order was faxed and I put on the order form that the new Adaptic order was to replace the Adaptic that was first sent.  She stated that she has already returned the other supplies.  I informed her that the representative asked me to put it on the form. She verbalized understanding and agreed.//AB/CMA

## 2019-12-06 ENCOUNTER — Ambulatory Visit (INDEPENDENT_AMBULATORY_CARE_PROVIDER_SITE_OTHER): Payer: Medicare Other | Admitting: Plastic Surgery

## 2019-12-06 ENCOUNTER — Encounter: Payer: Self-pay | Admitting: Plastic Surgery

## 2019-12-06 ENCOUNTER — Other Ambulatory Visit: Payer: Self-pay

## 2019-12-06 VITALS — BP 160/83 | HR 69 | Temp 97.3°F | Ht 77.0 in

## 2019-12-06 DIAGNOSIS — S91002A Unspecified open wound, left ankle, initial encounter: Secondary | ICD-10-CM

## 2019-12-06 NOTE — Progress Notes (Signed)
   Referring Provider No referring provider defined for this encounter.   CC:  Chief Complaint  Patient presents with  . Follow-up    3 weeks on debridement (L) ankle wound w/Acell placement      Phillip Hamilton is an 74 y.o. male.  HPI: Patient presents for follow-up after debridement of his left ankle wound a couple months ago.  He and his wife are overall very happy with the progress.  They feel like the wound is getting smaller.  He still does have some discomfort and restrictions in range of motion when walking around but he is gradually making progress.  Review of Systems General: Denies fevers, chills, changes in weight  Physical Exam Vitals with BMI 12/06/2019 11/16/2019 11/02/2019  Height 6\' 5"  - -  Weight (No Data) 218 lbs -  BMI - - -  Systolic 160 137  Diastolic 83 86 80  Pulse 69 71 74    General:  No acute distress,  Alert and oriented, Non-Toxic, Normal speech and affect The wound overall is looking quite good.  The open area is down to about a centimeter in diameter at this point.  There is no surrounding erythema and minimal swelling distally.  Assessment/Plan Patient is making progress with the overall wound and his functional status.  I recommended they decrease the size of the dressing but still keep the open area covered for now.  He has no restrictions from my standpoint in terms of weightbearing or range of motion to his ankle and wants to work through this on his own.  I did bring up the possibility of physical therapy to speed along this process but he wants to see how things go on his own for now.  I will plan to see him again in 3 weeks.  962 12/06/2019, 10:56 AM

## 2019-12-26 ENCOUNTER — Telehealth: Payer: Self-pay

## 2019-12-26 NOTE — Telephone Encounter (Signed)

## 2019-12-27 ENCOUNTER — Other Ambulatory Visit: Payer: Self-pay

## 2019-12-27 ENCOUNTER — Encounter: Payer: Self-pay | Admitting: Plastic Surgery

## 2019-12-27 ENCOUNTER — Ambulatory Visit (INDEPENDENT_AMBULATORY_CARE_PROVIDER_SITE_OTHER): Payer: Medicare Other | Admitting: Plastic Surgery

## 2019-12-27 VITALS — BP 129/79 | HR 61 | Temp 97.8°F | Ht 77.0 in | Wt 234.0 lb

## 2019-12-27 DIAGNOSIS — S91002A Unspecified open wound, left ankle, initial encounter: Secondary | ICD-10-CM | POA: Diagnosis not present

## 2019-12-27 NOTE — Progress Notes (Signed)
   Referring Provider No referring provider defined for this encounter.   CC:  Chief Complaint  Patient presents with  . Follow-up    follow up visit for ankle wound of the left      Phillip Hamilton is an 74 y.o. male.  HPI: Patient presents for follow-up regarding his left ankle wound. He is happy with his progress and thinks this is completely healed. He has been moving around more and has better range of motion in his ankle. He is curious about the swelling which is his main current issue. He says it will swell in the evening but go down overnight.  Review of Systems General: Denies fevers, chills, changes in weight  Physical Exam Vitals with BMI 12/27/2019 12/06/2019 11/16/2019  Height 6\' 5"  6\' 5"  -  Weight 234 lbs (No Data) 218 lbs  BMI 27.74 - -  Systolic 129 160  Diastolic 79 83 86  Pulse 61 69 71    General:  No acute distress,  Alert and oriented, Non-Toxic, Normal speech and affect Left ankle wound looks totally healed. The scar is improving as well. He has much better dorsiflexion plantarflexion of his ankle than previously. The swelling present on exam today is minimal and is all distal to his transverse previous laceration. There is no surrounding erythema and overall looks great.  Assessment/Plan Patient is all healed up at this point from a complex left ankle wound. He is very happy with his progress and I think his had a good outcome. I have suggested compression socks for the swelling which I believe will significantly decrease the swelling that occurs over the course of the day. He is on a use moisturizer to help with the dryness and the itching in that area. I will plan to see him again in a month but he may cancel the appointment if he does not think it is needed.  12/27/2019, 12:12 PM

## 2020-01-17 ENCOUNTER — Ambulatory Visit: Payer: Medicare Other | Admitting: Plastic Surgery

## 2021-02-27 ENCOUNTER — Encounter (HOSPITAL_COMMUNITY): Payer: Self-pay | Admitting: Emergency Medicine

## 2021-02-27 ENCOUNTER — Emergency Department (HOSPITAL_COMMUNITY)
Admission: EM | Admit: 2021-02-27 | Discharge: 2021-02-27 | Disposition: A | Discharge status: Home / Self Care | Payer: No Typology Code available for payment source | Attending: Emergency Medicine | Admitting: Emergency Medicine

## 2021-02-27 ENCOUNTER — Ambulatory Visit: Payer: Self-pay

## 2021-02-27 ENCOUNTER — Emergency Department (HOSPITAL_COMMUNITY): Payer: No Typology Code available for payment source

## 2021-02-27 ENCOUNTER — Other Ambulatory Visit: Payer: Self-pay

## 2021-02-27 DIAGNOSIS — Z87891 Personal history of nicotine dependence: Secondary | ICD-10-CM | POA: Insufficient documentation

## 2021-02-27 DIAGNOSIS — R0789 Other chest pain: Secondary | ICD-10-CM | POA: Diagnosis present

## 2021-02-27 DIAGNOSIS — R600 Localized edema: Secondary | ICD-10-CM | POA: Diagnosis not present

## 2021-02-27 DIAGNOSIS — R0602 Shortness of breath: Secondary | ICD-10-CM | POA: Diagnosis not present

## 2021-02-27 DIAGNOSIS — J45909 Unspecified asthma, uncomplicated: Secondary | ICD-10-CM | POA: Diagnosis not present

## 2021-02-27 DIAGNOSIS — I10 Essential (primary) hypertension: Secondary | ICD-10-CM | POA: Insufficient documentation

## 2021-02-27 DIAGNOSIS — I2510 Atherosclerotic heart disease of native coronary artery without angina pectoris: Secondary | ICD-10-CM | POA: Diagnosis not present

## 2021-02-27 DIAGNOSIS — R42 Dizziness and giddiness: Secondary | ICD-10-CM | POA: Insufficient documentation

## 2021-02-27 DIAGNOSIS — E119 Type 2 diabetes mellitus without complications: Secondary | ICD-10-CM | POA: Diagnosis not present

## 2021-02-27 DIAGNOSIS — Z7984 Long term (current) use of oral hypoglycemic drugs: Secondary | ICD-10-CM | POA: Diagnosis not present

## 2021-02-27 DIAGNOSIS — Z79899 Other long term (current) drug therapy: Secondary | ICD-10-CM | POA: Insufficient documentation

## 2021-02-27 DIAGNOSIS — Z7982 Long term (current) use of aspirin: Secondary | ICD-10-CM | POA: Diagnosis not present

## 2021-02-27 LAB — D-DIMER, QUANTITATIVE: D-Dimer, Quant: 0.27 ug/mL-FEU (ref 0.00–0.50)

## 2021-02-27 LAB — TROPONIN I (HIGH SENSITIVITY)
Troponin I (High Sensitivity): 7 ng/L (ref <18)
Troponin I (High Sensitivity): 8 ng/L (ref <18)

## 2021-02-27 LAB — BRAIN NATRIURETIC PEPTIDE: B Natriuretic Peptide: 24 pg/mL (ref 0.0–100.0)

## 2021-02-27 LAB — CBC
HCT: 42.2 % (ref 39.0–52.0)
Hemoglobin: 13.9 g/dL (ref 13.0–17.0)
MCH: 32.3 pg (ref 26.0–34.0)
MCHC: 32.9 g/dL (ref 30.0–36.0)
MCV: 97.9 fL (ref 80.0–100.0)
Platelets: 237 10*3/uL (ref 150–400)
RBC: 4.31 MIL/uL (ref 4.22–5.81)
RDW: 13.3 % (ref 11.5–15.5)
WBC: 5.1 10*3/uL (ref 4.0–10.5)
nRBC: 0 % (ref 0.0–0.2)

## 2021-02-27 LAB — BASIC METABOLIC PANEL
Anion gap: 10 (ref 5–15)
BUN: 14 mg/dL (ref 8–23)
CO2: 22 mmol/L (ref 22–32)
Calcium: 9.1 mg/dL (ref 8.9–10.3)
Chloride: 103 mmol/L (ref 98–111)
Creatinine, Ser: 0.93 mg/dL (ref 0.61–1.24)
GFR, Estimated: >60 mL/min (ref >60)
Glucose, Bld: 178 mg/dL — ABNORMAL HIGH (ref 70–99)
Potassium: 4 mmol/L (ref 3.5–5.1)
Sodium: 135 mmol/L (ref 135–145)

## 2021-02-27 MED ORDER — ASPIRIN 81 MG PO CHEW
324.0000 mg | CHEWABLE_TABLET | Freq: Once | ORAL | Status: AC
  Administered 2021-02-27: 324 mg via ORAL
  Filled 2021-02-27: qty 4

## 2021-02-27 MED ORDER — ALBUTEROL SULFATE HFA 108 (90 BASE) MCG/ACT IN AERS
2.0000 | INHALATION_SPRAY | Freq: Once | RESPIRATORY_TRACT | Status: AC
  Administered 2021-02-27: 2 via RESPIRATORY_TRACT
  Filled 2021-02-27: qty 6.7

## 2021-02-27 NOTE — Discharge Instructions (Signed)
You were given an ambulatory referral to cardiology.  They should be reaching out to you to schedule an appointment for follow-up.  You were also given information for a primary care provider.  You will need to call the office to schedule an appointment for follow-up.  Please monitor symptoms closely, if you have any new or worsening symptoms in the meantime including any exertional chest pain, worsening shortness of breath or other concerns please return to the emergency department immediately

## 2021-02-27 NOTE — ED Triage Notes (Signed)
Pt c/o left-sided chest pain x 5 days with SOB starting this am

## 2021-02-27 NOTE — ED Provider Notes (Signed)
[facility] EMERGENCY DEPARTMENT Provider Note   CSN: [id number] Arrival date & time: 02/27/21  1038     History Chief Complaint  Patient presents with  . Chest Pain    Phillip Hamilton is a 75 y.o. male.  HPI  HPI: A 75 year old patient with a history of treated diabetes, hypertension and hypercholesterolemia presents for evaluation of chest pain. Initial onset of pain was more than 6 hours ago. The patient's chest pain is described as heaviness/pressure/tightness and is not worse with exertion. The patient's chest pain is middle- or left-sided, is not well-localized, is not sharp and does radiate to the arms/jaw/neck. The patient does not complain of nausea and denies diaphoresis. The patient has no history of stroke, has no history of peripheral artery disease, has not smoked in the past 90 days, has no relevant family history of coronary artery disease (first degree relative at less than age 24) and does not have an elevated BMI (>=30). Pt is a 75 y/o male with a h/o asthma, DM, GERD, HTN, HLD, CAD, who presents to the ED today for eval of chest pain. Reports left sided chest pain ongoing constant for the last week. Pain intermittently radiates to the LUE. Rates pain 3/10.  Describes pain is an aching pain and intermittent tightness. Denies pleuritic pain, diaphoresis, NV. Reports associated SOB that is worse on exertion, dizziness, BLE swelling.   Denies leg swelling, hemoptysis, recent surgery/trauma, recent long travel, hormone use, personal hx of cancer, or hx of DVT/PE.   Denies tobacco use. Drinks 3-4 mixed drinks and/or wine daily. Denies drug use.   Last cath 12 years ago. Last stress was 5 years ago. Care was completed in [place].   Past Medical History:  Diagnosis Date  . Asthma    as a child  . Diabetes mellitus without complication (HCC)   . Dyspnea   . GERD (gastroesophageal reflux disease)   . Hypertension   . PTSD (post-traumatic stress disorder)     Patient  Active Problem List   Diagnosis Date Noted  . Open wound of left ankle with complication 09/28/2019    Past Surgical History:  Procedure Laterality Date  . cheek implants    . COLONOSCOPY    . eyelid surgery     lower lids  . FINGER SURGERY    . HERNIA REPAIR Left    inguinal  . I & D EXTREMITY Left 08/28/2019   Procedure: IRRIGATION AND DEBRIDEMENT LEFT ANKLE;  COMPLEX WOUND REPAIR LEFT ANKLE;  Surgeon: Sheral Apley, MD;  Location: [facility] OR;  Service: Orthopedics;  Laterality: Left;  . I & D EXTREMITY Left 09/25/2019   Procedure: debridement left ankle wound with Acell placement;  Surgeon: Allena Napoleon, MD;  Location: [facility] OR;  Service: Plastics;  Laterality: Left;  Marland Kitchen MANDIBLE SURGERY         History reviewed. No pertinent family history.  Social History   Tobacco Use  . Smoking status: Former Games developer  . Smokeless tobacco: Never Used  Vaping Use  . Vaping Use: Never used  Substance Use Topics  . Alcohol use: Yes    Alcohol/week: 1.0 standard drink    Types: 1 Glasses of wine per week    Comment: daily 1 wine with a "couple of cocktails"  . Drug use: Never    Home Medications Prior to Admission medications   Medication Sig Start Date End Date Taking? Authorizing Provider  aspirin EC 81 MG tablet Take 1 tablet (81 mg total)  by mouth 2 (two) times daily. For DVT prophylaxis for 30 days after surgery. Patient taking differently: Take 81 mg by mouth every evening. For DVT prophylaxis for 30 days after surgery. 08/28/19   Albina Billet III, PA-C  atorvastatin (LIPITOR) 40 MG tablet Take 20 mg by mouth at bedtime.    [provider]  docusate sodium (COLACE) 100 MG capsule Take 100 mg by mouth at bedtime.    [provider]  glimepiride (AMARYL) 2 MG tablet Take 2 mg by mouth daily.     [provider]  lisinopril (ZESTRIL) 40 MG tablet Take 20 mg by mouth every morning.    [provider]  metFORMIN (GLUCOPHAGE) 1000 MG tablet  Take 1,000 mg by mouth 2 (two) times daily with a meal.    [provider]  omeprazole (PRILOSEC) 20 MG capsule Take 20 mg by mouth 2 (two) times daily.    [provider]  vitamin B-12 (CYANOCOBALAMIN) 1000 MCG tablet Take 1,000 mcg by mouth daily.     [provider]    Allergies    Patient has no known allergies.  Review of Systems   Review of Systems  Constitutional: Negative for chills and fever.  HENT: Negative for ear pain and sore throat.   Eyes: Negative for visual disturbance.  Respiratory: Positive for shortness of breath. Negative for cough.   Cardiovascular: Positive for chest pain. Negative for leg swelling.  Gastrointestinal: Negative for abdominal pain and vomiting.  Genitourinary: Negative for dysuria and hematuria.  Musculoskeletal: Negative for back pain.  Skin: Positive for color change. Negative for rash.  Neurological: Negative for seizures and syncope.  All other systems reviewed and are negative.   Physical Exam Updated Vital Signs BP (!) 121/95 (BP Location: Left Arm)   Pulse 73   Temp 98 F (36.7 C) (Oral)   Resp 11   Ht 6\' 5"  (1.956 m)   Wt 99.8 kg   SpO2 96%   BMI 26.09 kg/m   Physical Exam Vitals and nursing note reviewed.  Constitutional:      Appearance: He is well-developed.  HENT:     Head: Normocephalic and atraumatic.  Eyes:     Conjunctiva/sclera: Conjunctivae normal.  Cardiovascular:     Rate and Rhythm: Normal rate and regular rhythm.     Heart sounds: Normal heart sounds. No murmur heard.   Pulmonary:     Effort: Pulmonary effort is normal. No respiratory distress.     Breath sounds: Normal breath sounds. No decreased breath sounds, wheezing, rhonchi or rales.  Abdominal:     Palpations: Abdomen is soft.     Tenderness: There is no abdominal tenderness.  Musculoskeletal:     Cervical back: Neck supple.     Right lower leg: No tenderness. No edema.     Left lower leg: No tenderness. No edema.   Skin:    General: Skin is warm and dry.  Neurological:     Mental Status: He is alert.     ED Results / Procedures / Treatments   Labs (all labs ordered are listed, but only abnormal results are displayed) Labs Reviewed  BASIC METABOLIC PANEL - Abnormal; Notable for the following components:      Result Value   Glucose, Bld 178 (*)    All other components within normal limits  CBC  D-DIMER, QUANTITATIVE  BRAIN NATRIURETIC PEPTIDE  TROPONIN I (HIGH SENSITIVITY)  TROPONIN I (HIGH SENSITIVITY)    EKG EKG Interpretation  Date/Time:  Thursday February 27 2021 11:39:23 EDT Ventricular Rate:  72 PR Interval:  154 QRS Duration: 80 QT Interval:  378 QTC Calculation: 413 R Axis:   55 Text Interpretation: Normal sinus rhythm Normal ECG Confirmed by Alona Bene [phone]) on 02/27/2021 12:27:46 PM   Radiology DG Chest 2 View  Result Date: 02/27/2021 CLINICAL DATA:  Left-sided chest pain for 5 days with shortness of breath EXAM: CHEST - 2 VIEW COMPARISON:  08/28/2019 FINDINGS: The heart size and mediastinal contours are within normal limits. Atherosclerotic calcification of the aortic knob. No focal airspace consolidation, pleural effusion, or pneumothorax. The visualized skeletal structures are unremarkable. IMPRESSION: No active cardiopulmonary disease. Electronically Signed   By: Duanne Guess D.O.   On: 02/27/2021 12:48    Procedures Procedures   Medications Ordered in ED Medications  albuterol (VENTOLIN HFA) 108 (90 Base) MCG/ACT inhaler 2 puff (2 puffs Inhalation Given 02/27/21 1438)  aspirin chewable tablet 324 mg (324 mg Oral Given 02/27/21 1438)    ED Course  I have reviewed the triage vital signs and the nursing notes.  Pertinent labs & imaging results that were available during my care of the patient were reviewed by me and considered in my medical decision making (see chart for details).    MDM Rules/Calculators/A&P HEAR Score: 52                         75 year old male presents the emergency department today for evaluation of chest pain ongoing constantly for the last week.  Reports some associated shortness of breath as well.  He has had no cough or other associated upper respiratory symptoms.  Reviewed/interpreted labs CBC, BMP are unremarkable D-dimer is negative BNP is negative Delta troponins are negative  EKG shows normal sinus rhythm, no acute ischemic changes or arrhythmia  Chest x-ray personally reviewed/interpreted and shows no acute abnormalities.  Patient has a heart score of 5, he does have increased risk factors for ACS however his presentation today does not appear consistent with this.  Given elevated heart score, he was offered admission however declined at this time and would prefer to be discharged with referral to cardiology.  Ambulatory referral given.  Additionally, while in the ED he was given albuterol and on reassessment he states his symptoms are somewhat improved from this so I advised him to continue this as an outpatient if it resolves his symptoms.  He does have history of asthma.  He is in agreement with the plan for follow-up, also given information for PCP.  I gave strict return precautions for any new or worsening symptoms.  He voices understanding of plan and reasons to return.  All questions answered.  Patient stable for discharge.  Case was discussed with supervising physician, Dr. Jacqulyn Bath who is in agreement with the plan.   Final Clinical Impression(s) / ED Diagnoses Final diagnoses:  Atypical chest pain    Rx / DC Orders ED Discharge Orders         Ordered    Ambulatory referral to Cardiology        02/27/21 [address] 02/27/21 1524    Maia Plan, MD 02/28/21 [phone]

## 2021-02-27 NOTE — Clinical Social Work Note (Signed)
[facility] notification done. [id number]

## 2021-04-10 ENCOUNTER — Ambulatory Visit: Payer: Medicare Other | Admitting: Cardiology

## 2021-06-01 ENCOUNTER — Ambulatory Visit
Admission: EM | Admit: 2021-06-01 | Discharge: 2021-06-01 | Disposition: A | Discharge status: Home / Self Care | Payer: Medicare Other | Attending: Family Medicine | Admitting: Family Medicine

## 2021-06-01 ENCOUNTER — Encounter: Payer: Self-pay | Admitting: Emergency Medicine

## 2021-06-01 ENCOUNTER — Other Ambulatory Visit: Payer: Self-pay

## 2021-06-01 DIAGNOSIS — W540XXA Bitten by dog, initial encounter: Secondary | ICD-10-CM | POA: Diagnosis not present

## 2021-06-01 DIAGNOSIS — S61431A Puncture wound without foreign body of right hand, initial encounter: Secondary | ICD-10-CM | POA: Diagnosis not present

## 2021-06-01 DIAGNOSIS — S61511A Laceration without foreign body of right wrist, initial encounter: Secondary | ICD-10-CM

## 2021-06-01 DIAGNOSIS — T148XXA Other injury of unspecified body region, initial encounter: Secondary | ICD-10-CM

## 2021-06-01 MED ORDER — METRONIDAZOLE 500 MG PO TABS: 500.0000 mg | ORAL_TABLET | Freq: Two times a day (BID) | ORAL | 0 refills | Status: AC

## 2021-06-01 MED ORDER — AMOXICILLIN-POT CLAVULANATE 875-125 MG PO TABS: 1.0000 | ORAL_TABLET | Freq: Two times a day (BID) | ORAL | 0 refills | Status: DC

## 2021-06-01 MED ORDER — METRONIDAZOLE 500 MG PO TABS: 500.0000 mg | ORAL_TABLET | Freq: Two times a day (BID) | ORAL | 0 refills | Status: DC

## 2021-06-01 MED ORDER — AMOXICILLIN-POT CLAVULANATE 875-125 MG PO TABS: 1.0000 | ORAL_TABLET | Freq: Two times a day (BID) | ORAL | 0 refills | Status: AC

## 2021-06-01 NOTE — ED Triage Notes (Addendum)
Pt bite by his own dog to his RT wrist.  Dog has up to date rabies vaccines.  Last tetanus vaccine in 2020

## 2021-06-01 NOTE — ED Provider Notes (Addendum)
[facility]    CSN: [id number] Arrival date & time: 06/01/21  1334      History   Chief Complaint Chief Complaint  Patient presents with   Animal Bite    HPI Phillip Hamilton is a 75 y.o. male.   HPI Patient presents today for evaluation of a dog bite to the right wrist.  Patient was bitten by his personal dog.  The dog is up-to-date on all of his rabies vaccines.  Patient has had his last tetanus vaccine in 2020.  He reports that the dog is not not aggressive however he was attempting to take something from the dog and the dog reacted and subsequently bit him.  This happened approximately 9-10 hours ago.  Patient's wife cleansed hand and bandaged to control bleeding.  Patient endorses full sensation of the wrist and of his distal fingers.  Patient is a type II diabetic and is concerned regarding infection. Past Medical History:  Diagnosis Date   Asthma    as a child   Diabetes mellitus without complication (HCC)    Dyspnea    GERD (gastroesophageal reflux disease)    Hypertension    PTSD (post-traumatic stress disorder)     Patient Active Problem List   Diagnosis Date Noted   Open wound of left ankle with complication 09/28/2019    Past Surgical History:  Procedure Laterality Date   cheek implants     COLONOSCOPY     eyelid surgery     lower lids   FINGER SURGERY     HERNIA REPAIR Left    inguinal   I & D EXTREMITY Left 08/28/2019   Procedure: IRRIGATION AND DEBRIDEMENT LEFT ANKLE;  COMPLEX WOUND REPAIR LEFT ANKLE;  Surgeon: Sheral Apley, MD;  Location: [facility] OR;  Service: Orthopedics;  Laterality: Left;   I & D EXTREMITY Left 09/25/2019   Procedure: debridement left ankle wound with Acell placement;  Surgeon: Allena Napoleon, MD;  Location[facility] OR;  Service: Plastics;  Laterality: Left;   MANDIBLE SURGERY         Home Medications    Prior to Admission medications   Medication Sig Start Date End Date Taking? Authorizing Provider  aspirin EC 81 MG  tablet Take 1 tablet (81 mg total) by mouth 2 (two) times daily. For DVT prophylaxis for 30 days after surgery. Patient taking differently: Take 81 mg by mouth every evening. For DVT prophylaxis for 30 days after surgery. 08/28/19   Albina Billet III, PA-C  atorvastatin (LIPITOR) 40 MG tablet Take 20 mg by mouth at bedtime.    [provider]  docusate sodium (COLACE) 100 MG capsule Take 100 mg by mouth at bedtime.    [provider]  glimepiride (AMARYL) 2 MG tablet Take 2 mg by mouth daily.     [provider]  lisinopril (ZESTRIL) 40 MG tablet Take 20 mg by mouth every morning.    [provider]  metFORMIN (GLUCOPHAGE) 1000 MG tablet Take 1,000 mg by mouth 2 (two) times daily with a meal.    [provider]  omeprazole (PRILOSEC) 20 MG capsule Take 20 mg by mouth 2 (two) times daily.    [provider]  vitamin B-12 (CYANOCOBALAMIN) 1000 MCG tablet Take 1,000 mcg by mouth daily.     [provider]    Family History No family history on file.  Social History Social History   Tobacco Use   Smoking status: Former  Pack years: 0.00   Smokeless tobacco: Never  Vaping Use   Vaping Use: Never used  Substance Use Topics   Alcohol use: Yes    Alcohol/week: 1.0 standard drink    Types: 1 Glasses of wine per week    Comment: daily 1 wine with a "couple of cocktails"   Drug use: Never     Allergies   Patient has no known allergies.   Review of Systems Review of Systems Pertinent negatives listed in HPI  Physical Exam Triage Vital Signs ED Triage Vitals [06/01/21 1507]  Enc Vitals Group     BP 125/77     Pulse Rate 63     Resp 19     Temp 99.5 F (37.5 C)     Temp Source Oral     SpO2 98 %     Weight      Height      Head Circumference      Peak Flow      Pain Score 5     Pain Loc      Pain Edu?      Excl. in GC?    No data found.  Updated Vital Signs BP 125/77 (BP Location: Right Arm)    Pulse 63   Temp 99.5 F (37.5 C) (Oral)   Resp 19   SpO2 98%   Visual Acuity Right Eye Distance:   Left Eye Distance:   Bilateral Distance:    Right Eye Near:   Left Eye Near:    Bilateral Near:     Physical Exam General appearance: Alert, well developed, well nourished, cooperative and in no distress Head: Normocephalic, without obvious abnormality, atraumatic Respiratory: Respirations even and unlabored, normal respiratory rate Heart: Rate and rhythm normal. No gallop or murmurs noted on exam  Extremities: Right wrist 5.5 cm laceration proximal dorsum hand , multiple abrasions, deep puncture wound palm of hand, and puncture wound distal radius (palmer) Skin: Skin color, texture, turgor normal. No rashes seen  Psych: Appropriate mood and affect. Neurologic: Neurovascular status within normal limits UC Treatments / Results  Labs (all labs ordered are listed, but only abnormal results are displayed) Labs Reviewed - No data to display  EKG   Radiology No results found.  Procedures Wound Care  Date/Time: 06/01/2021 3:07 PM Performed by: Bing Neighbors, FNP Authorized by: Bing Neighbors, FNP   Consent:    Consent obtained:  Verbal   Risks discussed:  Infection Universal protocol:    Patient identity confirmed:  Verbally with patient Procedure details:    Indications: open wounds     Wound location:  Hand   Hand location:  L hand, dorsum   Wound age (days):  <1   Wound surface area (sq cm):  5.5 Skin layer closed with:    Wound care performed:  Steri-strips placed Dressing:    Dressing applied:  Kerlix, Steri-Strips and occlusive Post-procedure details:    Procedure completion:  Tolerated with difficulty (including critical care time)  Medications Ordered in UC Medications - No data to display  Initial Impression / Assessment and Plan / UC Course  I have reviewed the triage vital signs and the nursing notes.  Pertinent labs & imaging results that  were available during my care of the patient were reviewed by me and considered in my medical decision making (see chart for details).    Dog bite resulting in a laceration of the right wrist and puncture wounds.  Wound care performed, pressure  dressing placed to control bleeding.  Will cover with metronidazole and Augmentin for coverage of both anaerobic and anaerobe bacteria.  Strict ER precautions given wounds appear infected or if redness or swelling develops at the site as these are signs of possible cellulitis.  Wound care instructions provided.  Final Clinical Impressions(s) / UC Diagnoses   Final diagnoses:  Dog bite, initial encounter  Laceration of right wrist, initial encounter  Puncture wound   Discharge Instructions   None    ED Prescriptions     Medication Sig Dispense Auth. Provider   amoxicillin-clavulanate (AUGMENTIN) 875-125 MG tablet  (Status: Discontinued) Take 1 tablet by mouth 2 (two) times daily. 20 tablet Bing Neighbors, FNP   metroNIDAZOLE (FLAGYL) 500 MG tablet  (Status: Discontinued) Take 1 tablet (500 mg total) by mouth 2 (two) times daily. 14 tablet Bing Neighbors, FNP   amoxicillin-clavulanate (AUGMENTIN) 875-125 MG tablet Take 1 tablet by mouth 2 (two) times daily. 20 tablet Bing Neighbors, FNP   metroNIDAZOLE (FLAGYL) 500 MG tablet Take 1 tablet (500 mg total) by mouth 2 (two) times daily. 14 tablet Bing Neighbors, FNP      PDMP not reviewed this encounter.   Bing Neighbors, FNP 06/02/21 1744    Bing Neighbors, FNP 06/02/21 1750

## 2021-12-15 IMAGING — DX DG CHEST 2V
3 series · 3 of 3 positions shown · non-contrast
Comparison: 08/28/2019

CLINICAL DATA: Left-sided chest pain for 5 days with shortness of
breath

EXAM:
CHEST - 2 VIEW

[chest pa]
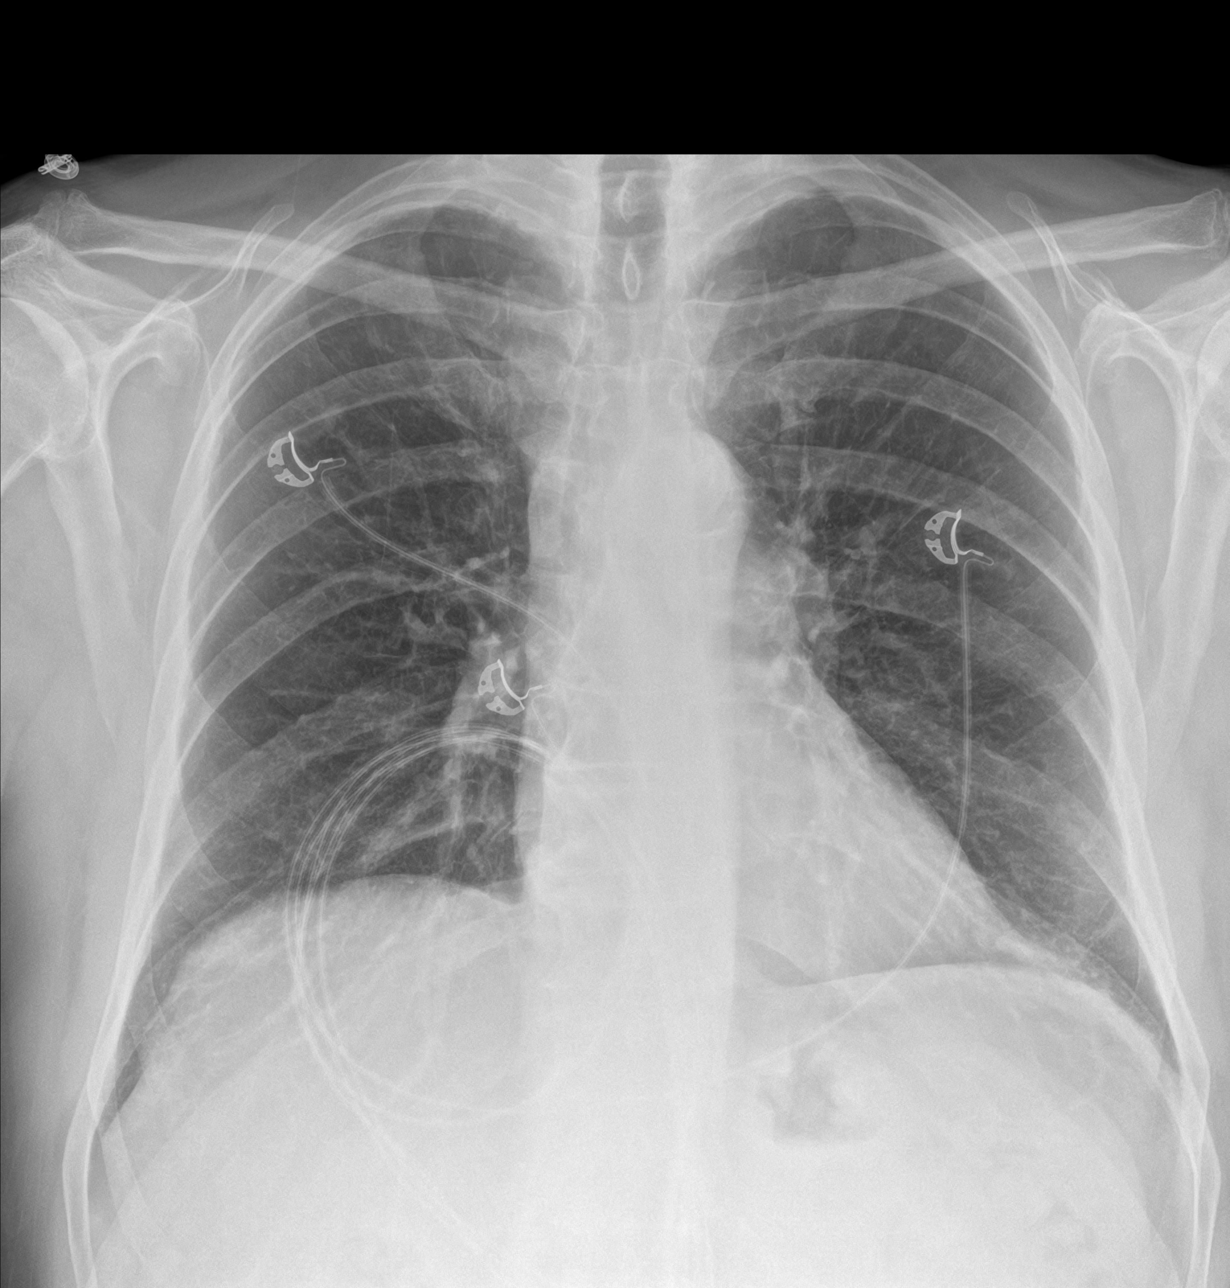

[chest lat (1 of 2)]
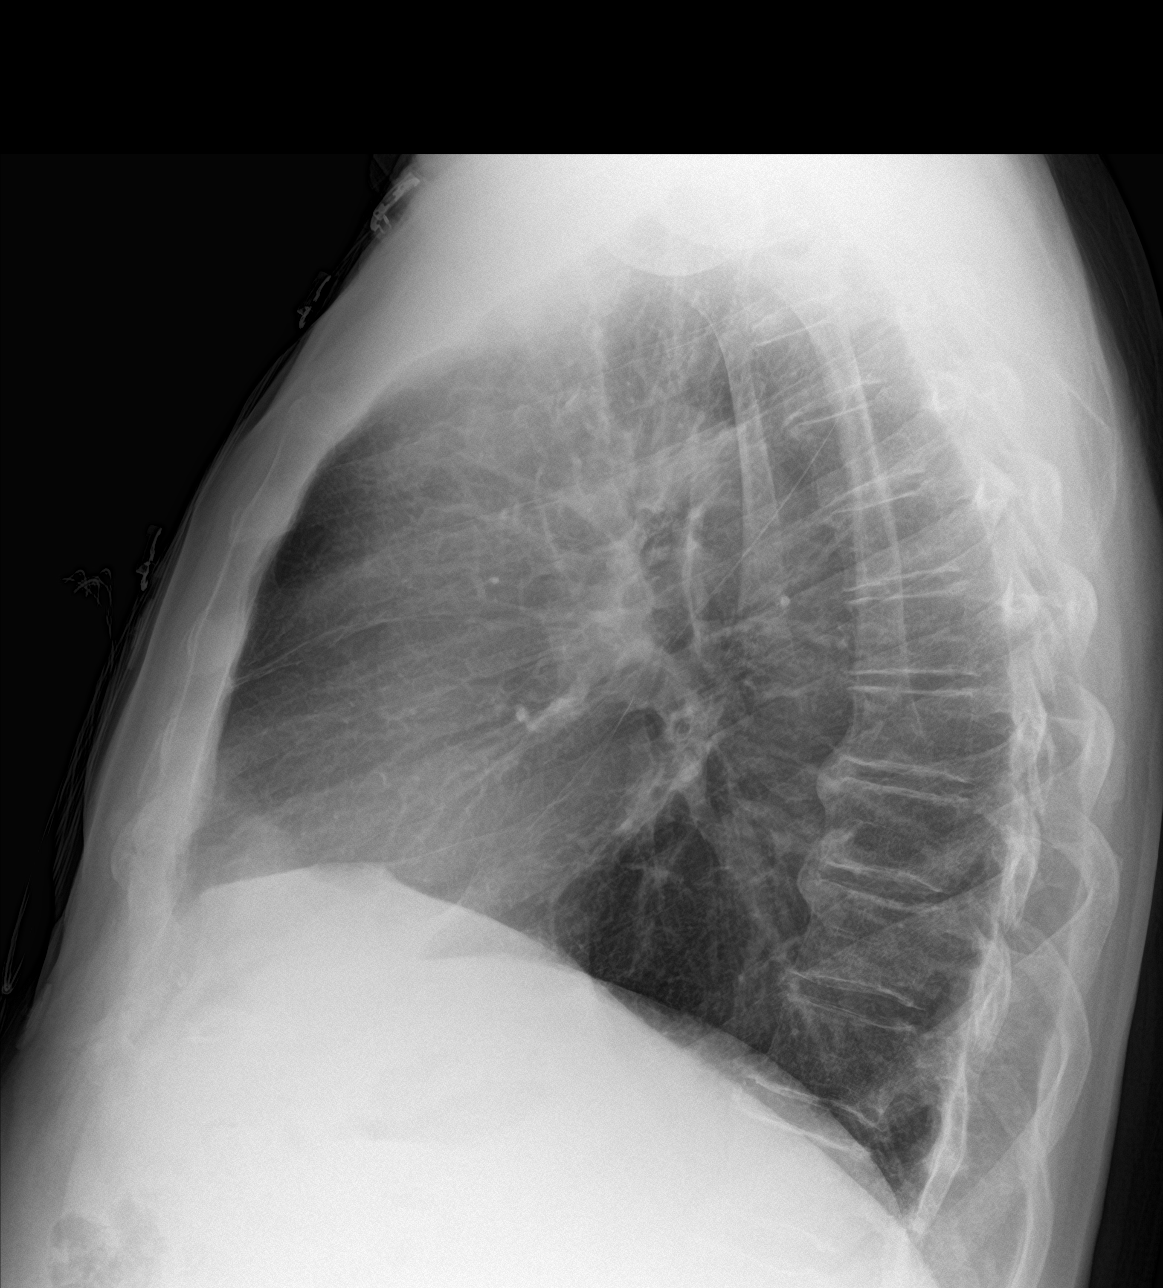

[chest lat (2 of 2)]
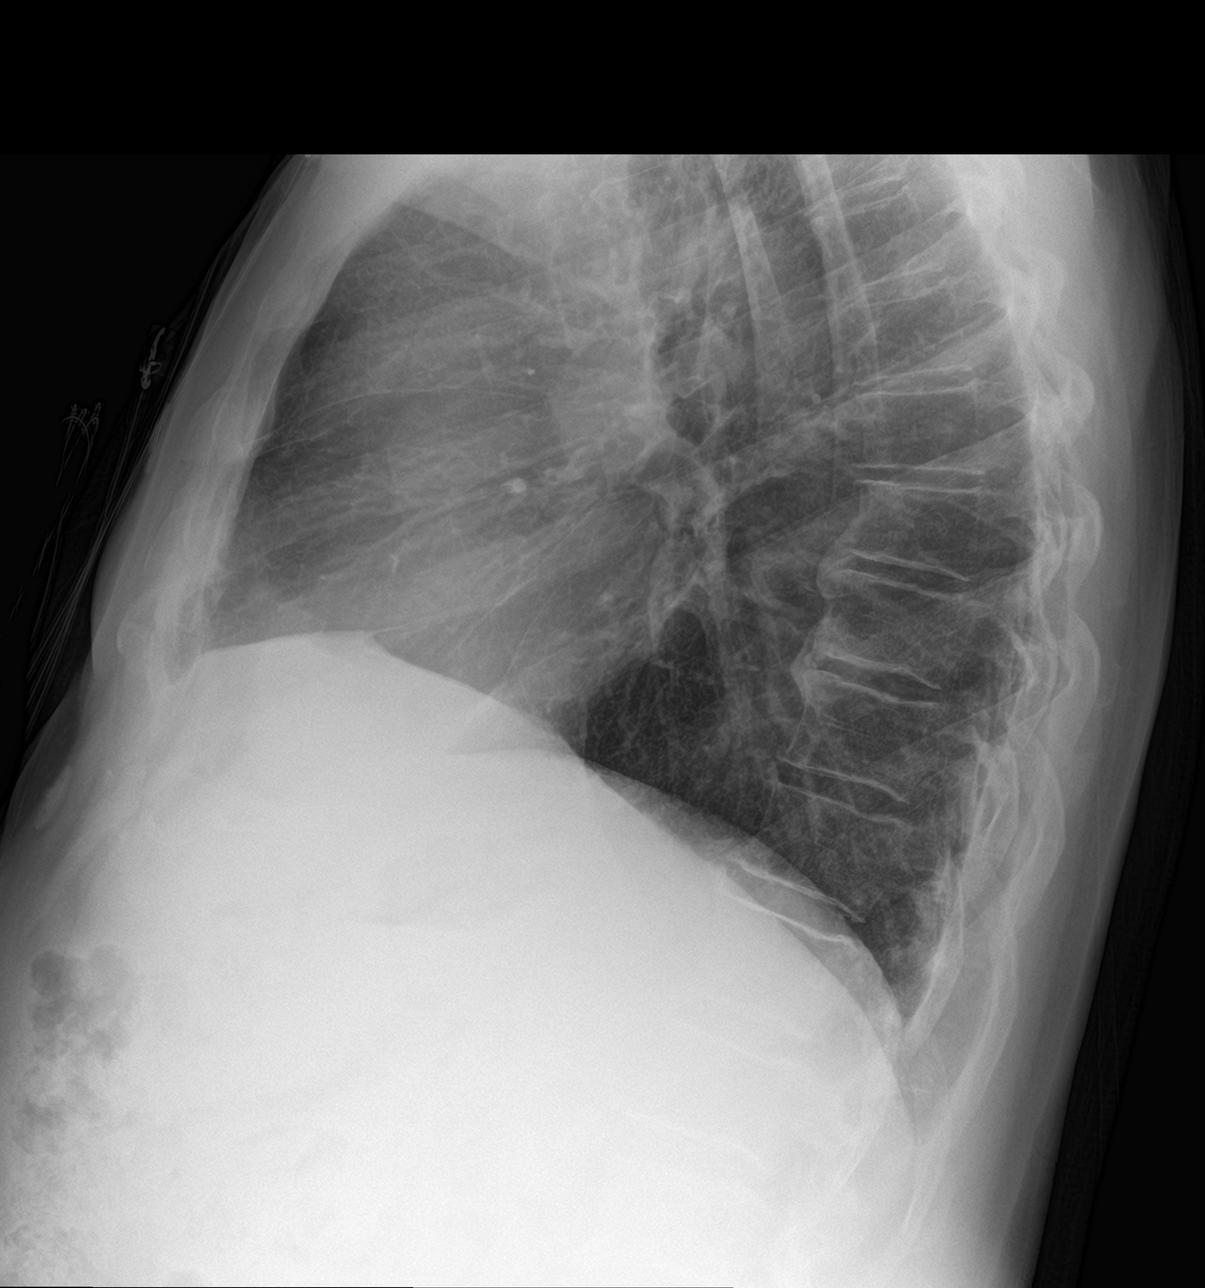

[3 of 3 positions shown; findings below may reference images not displayed]

FINDINGS: The heart size and mediastinal contours are within normal limits.
Atherosclerotic calcification of the aortic knob. No focal airspace
consolidation, pleural effusion, or pneumothorax. The visualized
skeletal structures are unremarkable.
IMPRESSION: No active cardiopulmonary disease.
